# Patient Record
Sex: Female | Born: 1965 | Race: Black or African American | Hispanic: No | Marital: Married | Smoking: Former smoker
Health system: Southern US, Community
[De-identification: ages and names within clinical notes are randomized; demographics above are authoritative.]

## PROBLEM LIST (undated history)

## (undated) DIAGNOSIS — N200 Calculus of kidney: Secondary | ICD-10-CM

## (undated) DIAGNOSIS — I10 Essential (primary) hypertension: Secondary | ICD-10-CM

## (undated) DIAGNOSIS — G510 Bell's palsy: Secondary | ICD-10-CM

---

## 1998-03-09 ENCOUNTER — Emergency Department (HOSPITAL_COMMUNITY): Admission: EM | Admit: 1998-03-09 | Discharge: 1998-03-09 | Payer: Self-pay | Admitting: Emergency Medicine

## 2000-01-01 ENCOUNTER — Ambulatory Visit (HOSPITAL_COMMUNITY): Admission: RE | Admit: 2000-01-01 | Discharge: 2000-01-01 | Payer: Self-pay | Admitting: *Deleted

## 2001-01-08 ENCOUNTER — Other Ambulatory Visit: Admission: RE | Admit: 2001-01-08 | Discharge: 2001-01-08 | Payer: Self-pay | Admitting: Family Medicine

## 2001-12-24 ENCOUNTER — Emergency Department (HOSPITAL_COMMUNITY): Admission: EM | Admit: 2001-12-24 | Discharge: 2001-12-24 | Payer: Self-pay | Admitting: Emergency Medicine

## 2003-06-08 ENCOUNTER — Ambulatory Visit (HOSPITAL_COMMUNITY): Admission: RE | Admit: 2003-06-08 | Discharge: 2003-06-08 | Payer: Self-pay | Admitting: Internal Medicine

## 2003-06-08 ENCOUNTER — Encounter: Payer: Self-pay | Admitting: Internal Medicine

## 2005-07-30 ENCOUNTER — Emergency Department (HOSPITAL_COMMUNITY): Admission: EM | Admit: 2005-07-30 | Discharge: 2005-07-31 | Payer: Self-pay | Admitting: *Deleted

## 2009-01-30 ENCOUNTER — Emergency Department (HOSPITAL_COMMUNITY): Admission: EM | Admit: 2009-01-30 | Discharge: 2009-01-30 | Payer: Self-pay | Admitting: Emergency Medicine

## 2009-04-03 ENCOUNTER — Other Ambulatory Visit: Admission: RE | Admit: 2009-04-03 | Discharge: 2009-04-03 | Payer: Self-pay | Admitting: Family Medicine

## 2010-07-18 IMAGING — CT CT HEAD W/O CM
1 series · 16 of 30 positions shown, 20 images · non-contrast
Comparison: None

CLINICAL DATA: Headache.  Left facial numbness and drooping.

CT HEAD WITHOUT CONTRAST
TECHNIQUE: Contiguous axial images were obtained from the base of
the skull through the vertex without contrast.

[Series 2: head_seq 4.5 h37s st · axial · 0.43mm/px · z∈[-102,+24]mm · 16 of 32 slices shown, 20 images]
[im 2/32  brain]
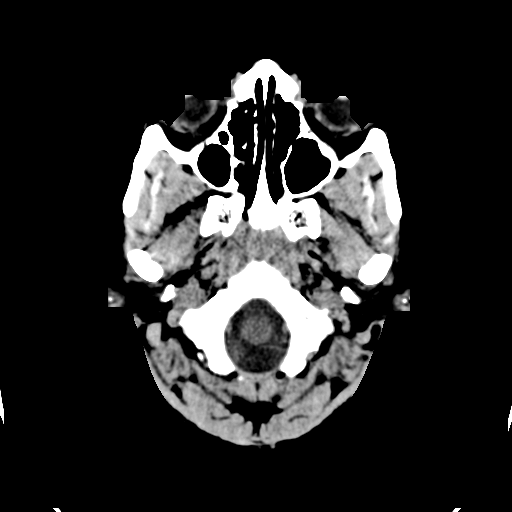
[im 2/32  bone]
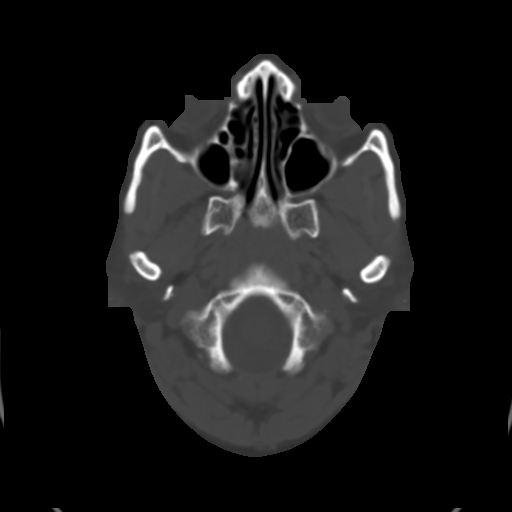
[im 4/32  brain]
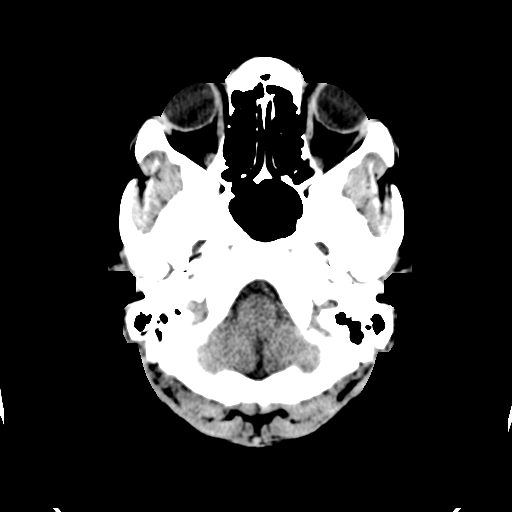
[im 6/32  brain]
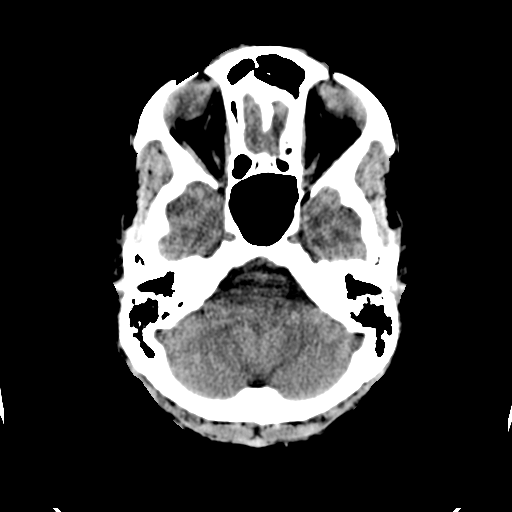
[im 8/32  brain]
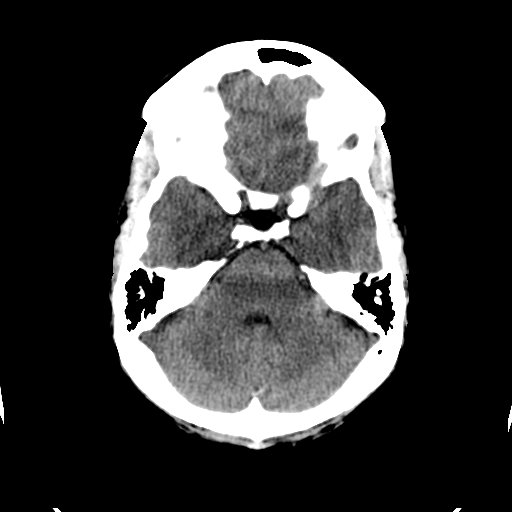
[im 9/32  brain]
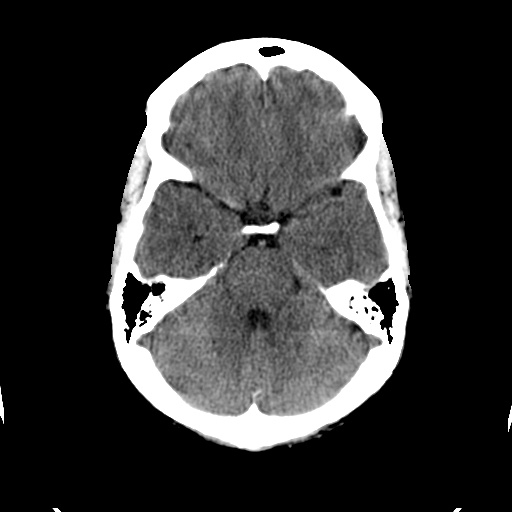
[im 9/32  bone]
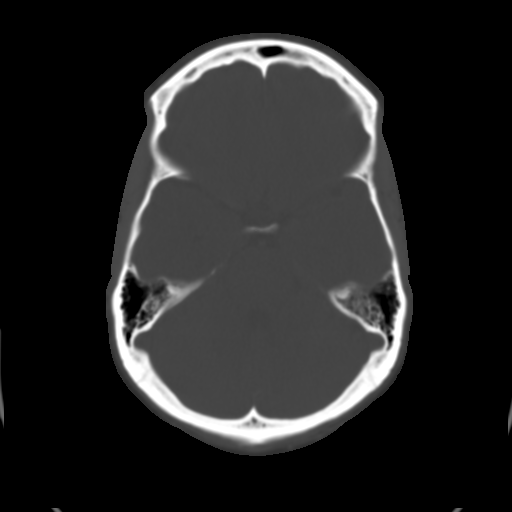
[im 11/32  brain]
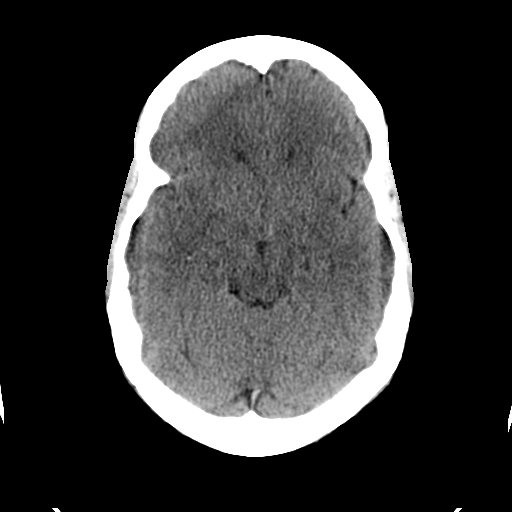
[im 13/32  brain]
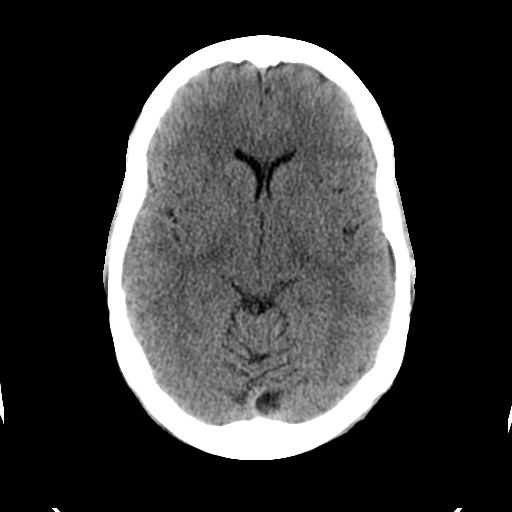
[im 15/32  brain]
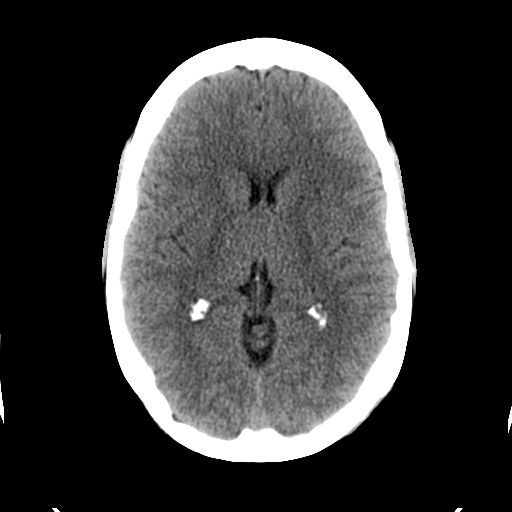
[im 17/32  brain]
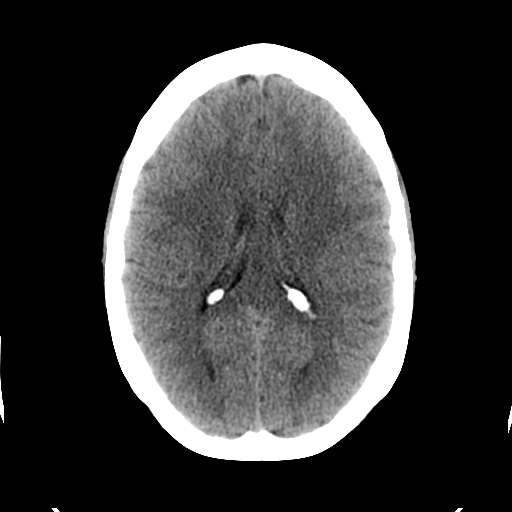
[im 17/32  bone]
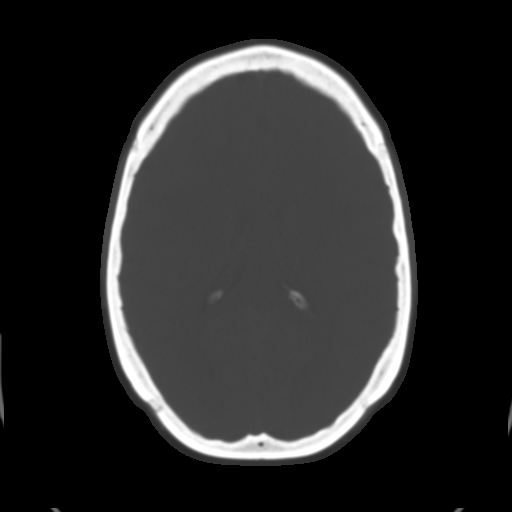
[im 19/32  brain]
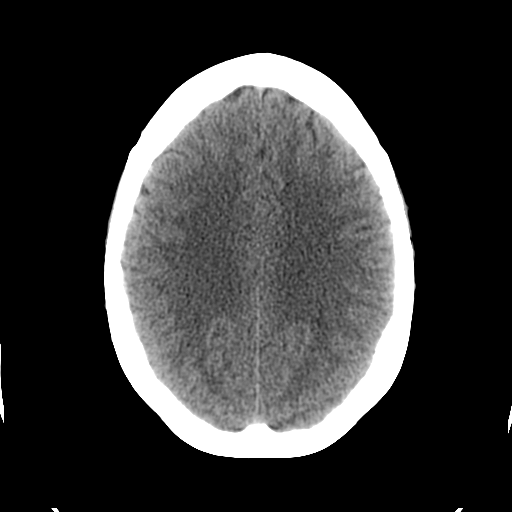
[im 21/32  brain]
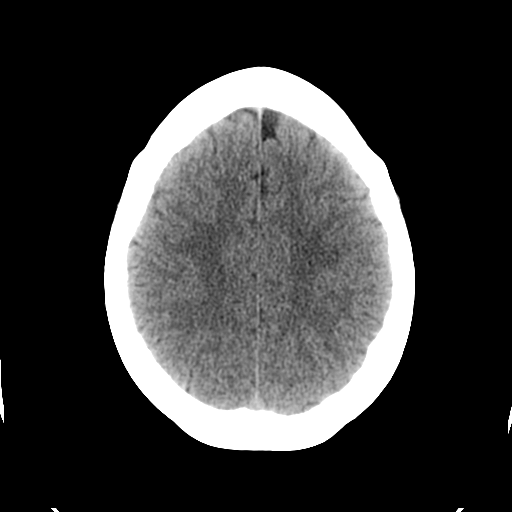
[im 23/32  brain]
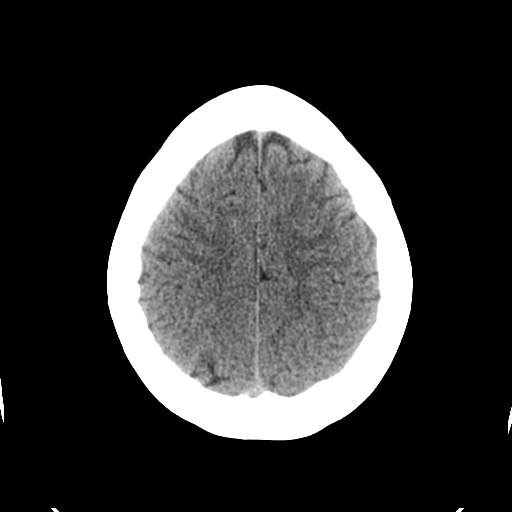
[im 24/32  brain]
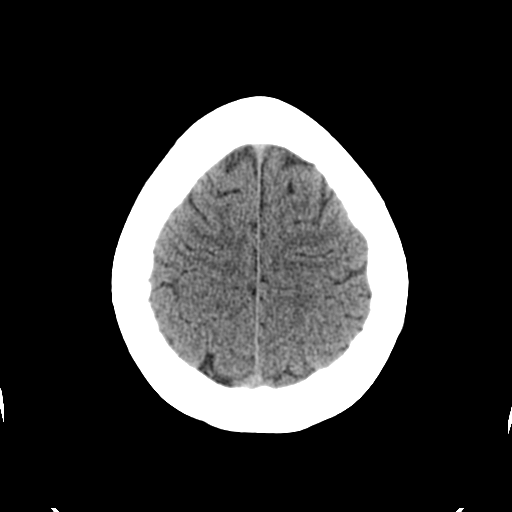
[im 24/32  bone]
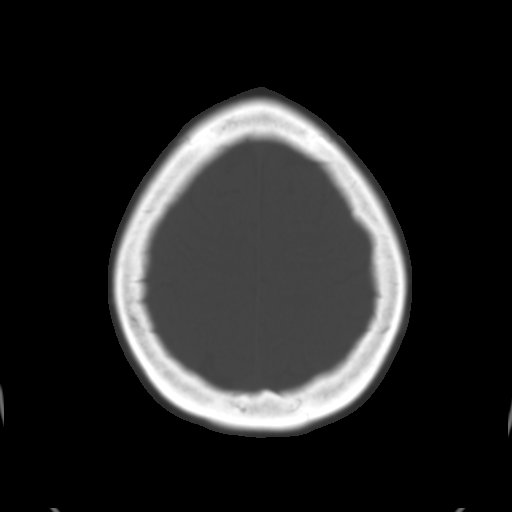
[im 26/32  brain]
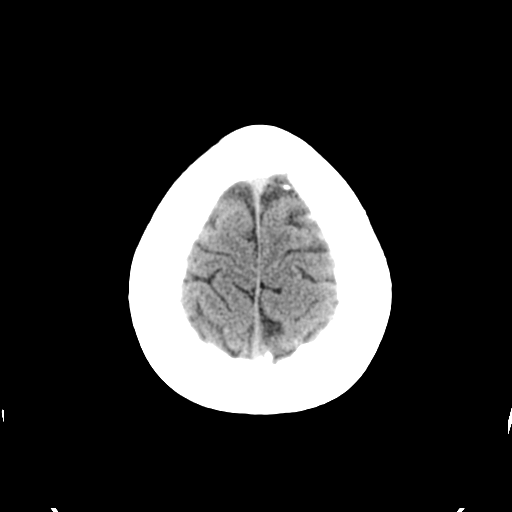
[im 28/32  brain]
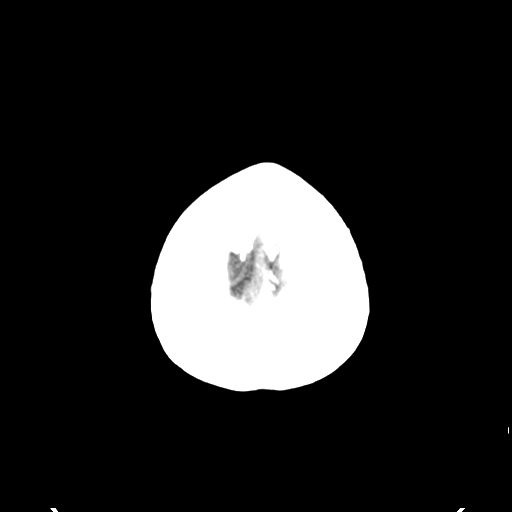
[im 30/32  brain]
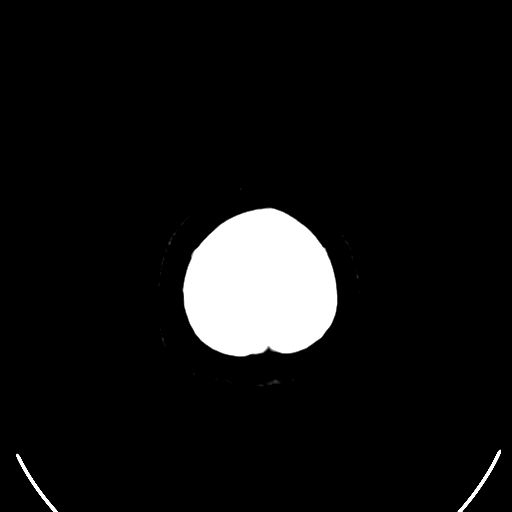

[16 of 30 positions shown; findings below may reference images not displayed]

FINDINGS: The brain stem, cerebellum, cerebral peduncles, thalami,
basal ganglia, basilar cisterns, and ventricular system appear
unremarkable.

No intracranial hemorrhage, mass lesion, or acute infarction is
identified.
IMPRESSION: 1.  No acute intracranial findings are identified. Please note that
acute CVA can be occult on CT scan.

## 2015-06-06 ENCOUNTER — Emergency Department (HOSPITAL_COMMUNITY)
Admission: EM | Admit: 2015-06-06 | Discharge: 2015-06-06 | Disposition: A | Discharge status: Home / Self Care | Payer: Self-pay | Attending: Emergency Medicine | Admitting: Emergency Medicine

## 2015-06-06 ENCOUNTER — Encounter (HOSPITAL_COMMUNITY): Payer: Self-pay | Admitting: Emergency Medicine

## 2015-06-06 DIAGNOSIS — Z87442 Personal history of urinary calculi: Secondary | ICD-10-CM | POA: Insufficient documentation

## 2015-06-06 DIAGNOSIS — Z720 Tobacco use: Secondary | ICD-10-CM | POA: Insufficient documentation

## 2015-06-06 DIAGNOSIS — R109 Unspecified abdominal pain: Secondary | ICD-10-CM | POA: Insufficient documentation

## 2015-06-06 HISTORY — DX: Calculus of kidney: N20.0

## 2015-06-06 LAB — CBC WITH DIFFERENTIAL/PLATELET
Basophils Absolute: 0 10*3/uL (ref 0.0–0.1)
Basophils Relative: 0 % (ref 0–1)
Eosinophils Absolute: 0.1 10*3/uL (ref 0.0–0.7)
Eosinophils Relative: 1 % (ref 0–5)
HCT: 36.7 % (ref 36.0–46.0)
Hemoglobin: 12.7 g/dL (ref 12.0–15.0)
Lymphocytes Relative: 50 % — ABNORMAL HIGH (ref 12–46)
Lymphs Abs: 2.7 10*3/uL (ref 0.7–4.0)
MCH: 30.7 pg (ref 26.0–34.0)
MCHC: 34.6 g/dL (ref 30.0–36.0)
MCV: 88.6 fL (ref 78.0–100.0)
Monocytes Absolute: 0.3 10*3/uL (ref 0.1–1.0)
Monocytes Relative: 5 % (ref 3–12)
Neutro Abs: 2.5 10*3/uL (ref 1.7–7.7)
Neutrophils Relative %: 44 % (ref 43–77)
Platelets: 252 10*3/uL (ref 150–400)
RBC: 4.14 MIL/uL (ref 3.87–5.11)
RDW: 12.8 % (ref 11.5–15.5)
WBC: 5.6 10*3/uL (ref 4.0–10.5)

## 2015-06-06 LAB — URINE MICROSCOPIC-ADD ON

## 2015-06-06 LAB — COMPREHENSIVE METABOLIC PANEL
ALBUMIN: 4 g/dL (ref 3.5–5.0)
ALK PHOS: 97 U/L (ref 38–126)
ALT: 14 U/L (ref 14–54)
ANION GAP: 5 (ref 5–15)
AST: 23 U/L (ref 15–41)
BUN: 13 mg/dL (ref 6–20)
CO2: 26 mmol/L (ref 22–32)
CREATININE: 0.74 mg/dL (ref 0.44–1.00)
Calcium: 8.8 mg/dL — ABNORMAL LOW (ref 8.9–10.3)
Chloride: 105 mmol/L (ref 101–111)
GFR calc non Af Amer: >60 mL/min (ref >60)
GLUCOSE: 107 mg/dL — AB (ref 65–99)
POTASSIUM: 3.3 mmol/L — AB (ref 3.5–5.1)
Sodium: 136 mmol/L (ref 135–145)
Total Bilirubin: 0.4 mg/dL (ref 0.3–1.2)
Total Protein: 7.9 g/dL (ref 6.5–8.1)

## 2015-06-06 LAB — URINALYSIS, ROUTINE W REFLEX MICROSCOPIC
BILIRUBIN URINE: NEGATIVE
GLUCOSE, UA: NEGATIVE mg/dL
KETONES UR: NEGATIVE mg/dL
Nitrite: NEGATIVE
Protein, ur: NEGATIVE mg/dL
Specific Gravity, Urine: 1.023 (ref 1.005–1.030)
Urobilinogen, UA: 0.2 mg/dL (ref 0.0–1.0)
pH: 6.5 (ref 5.0–8.0)

## 2015-06-06 MED ORDER — IBUPROFEN 800 MG PO TABS
800.0000 mg | ORAL_TABLET | Freq: Once | ORAL | Status: AC
  Administered 2015-06-06: 800 mg via ORAL
  Filled 2015-06-06: qty 1

## 2015-06-06 MED ORDER — IBUPROFEN 600 MG PO TABS: 600.0000 mg | ORAL_TABLET | Freq: Three times a day (TID) | ORAL | Status: DC | PRN

## 2015-06-06 NOTE — ED Notes (Signed)
Pt reports RLQ pain for past week that has gradually gotten worse. Hx of kidney stones, pt reports it feels like same. Also reports urinary frequency and discharge (unsure if vaginal). No N/v/d.

## 2015-06-06 NOTE — ED Provider Notes (Signed)
CSN: [id number]     Arrival date & time 06/06/15  0724 History   First MD Initiated Contact with Patient 06/06/15 0730     Chief Complaint  Patient presents with  . Abdominal Pain     HPI Patient presents to the emergency department complaining of right lower quadrant pain over the past week has gradually worsened.  She states it hurts when she walks around and hurts when she sits up.  She denies abdominal tenderness.  No fevers or chills.  Denies nausea vomiting diarrhea.  No melena or hematochezia.  No urinary symptoms.  No back pain or flank pain.   Past Medical History  Diagnosis Date  . Kidney stone    History reviewed. No pertinent past surgical history. History reviewed. No pertinent family history. History  Substance Use Topics  . Smoking status: Current Some Day Smoker  . Smokeless tobacco: Not on file  . Alcohol Use: No   OB History    No data available     Review of Systems  All other systems reviewed and are negative.     Allergies  Review of patient's allergies indicates no known allergies.  Home Medications   Prior to Admission medications   Medication Sig Start Date End Date Taking? Authorizing Provider  ibuprofen (ADVIL,MOTRIN) 600 MG tablet Take 1 tablet (600 mg total) by mouth every 8 (eight) hours as needed. 06/06/15   Azalia Bilis, MD   BP 156/93 mmHg  Pulse 55  Temp(Src) 97.9 F (36.6 C) (Oral)  Resp 16  SpO2 100% Physical Exam  Constitutional: She is oriented to person, place, and time. She appears well-developed and well-nourished. No distress.  HENT:  Head: Normocephalic and atraumatic.  Eyes: EOM are normal.  Neck: Normal range of motion.  Cardiovascular: Normal rate, regular rhythm and normal heart sounds.   Pulmonary/Chest: Effort normal and breath sounds normal.  Abdominal: Soft. She exhibits no distension. There is no tenderness.  Musculoskeletal: Normal range of motion.  Neurological: She is alert and oriented to person, place,  and time.  Skin: Skin is warm and dry.  Psychiatric: She has a normal mood and affect. Judgment normal.  Nursing note and vitals reviewed.   ED Course  Procedures (including critical care time) Labs Review Labs Reviewed  CBC WITH DIFFERENTIAL/PLATELET - Abnormal; Notable for the following:    Lymphocytes Relative 50 (*)    All other components within normal limits  COMPREHENSIVE METABOLIC PANEL - Abnormal; Notable for the following:    Potassium 3.3 (*)    Glucose, Bld 107 (*)    Calcium 8.8 (*)    All other components within normal limits  URINALYSIS, ROUTINE W REFLEX MICROSCOPIC (NOT AT [facility]) - Abnormal; Notable for the following:    APPearance CLOUDY (*)    Hgb urine dipstick SMALL (*)    Leukocytes, UA TRACE (*)    All other components within normal limits  URINE MICROSCOPIC-ADD ON    Imaging Review No results found.   EKG Interpretation None      MDM   Final diagnoses:  Abdominal pain, unspecified abdominal location    Repeat abdominal exam is without tenderness.  No fever.  What blood cell count is normal.  No other associated symptoms.  Urine shows no signs of infection.  Patient feels much better after single dose of ibuprofen.  Discharge home in good condition.  Ambulatory.  Doubt appendicitis.  Doubt ovarian pathology.    Azalia Bilis, MD 06/06/15 1057

## 2015-06-06 NOTE — Discharge Instructions (Signed)
Abdominal Pain, Women °Abdominal (stomach, pelvic, or belly) pain can be caused by many things. It is important to tell your doctor: °· The location of the pain. °· Does it come and go or is it present all the time? °· Are there things that start the pain (eating certain foods, exercise)? °· Are there other symptoms associated with the pain (fever, nausea, vomiting, diarrhea)? °All of this is helpful to know when trying to find the cause of the pain. °CAUSES  °· Stomach: virus or bacteria infection, or ulcer. °· Intestine: appendicitis (inflamed appendix), regional ileitis (Crohn's disease), ulcerative colitis (inflamed colon), irritable bowel syndrome, diverticulitis (inflamed diverticulum of the colon), or cancer of the stomach or intestine. °· Gallbladder disease or stones in the gallbladder. °· Kidney disease, kidney stones, or infection. °· Pancreas infection or cancer. °· Fibromyalgia (pain disorder). °· Diseases of the female organs: °¨ Uterus: fibroid (non-cancerous) tumors or infection. °¨ Fallopian tubes: infection or tubal pregnancy. °¨ Ovary: cysts or tumors. °¨ Pelvic adhesions (scar tissue). °¨ Endometriosis (uterus lining tissue growing in the pelvis and on the pelvic organs). °¨ Pelvic congestion syndrome (female organs filling up with blood just before the menstrual period). °¨ Pain with the menstrual period. °¨ Pain with ovulation (producing an egg). °¨ Pain with an IUD (intrauterine device, birth control) in the uterus. °¨ Cancer of the female organs. °· Functional pain (pain not caused by a disease, may improve without treatment). °· Psychological pain. °· Depression. °DIAGNOSIS  °Your doctor will decide the seriousness of your pain by doing an examination. °· Blood tests. °· X-rays. °· Ultrasound. °· CT scan (computed tomography, special type of X-ray). °· MRI (magnetic resonance imaging). °· Cultures, for infection. °· Barium enema (dye inserted in the large intestine, to better view it with  X-rays). °· Colonoscopy (looking in intestine with a lighted tube). °· Laparoscopy (minor surgery, looking in abdomen with a lighted tube). °· Major abdominal exploratory surgery (looking in abdomen with a large incision). °TREATMENT  °The treatment will depend on the cause of the pain.  °· Many cases can be observed and treated at home. °· Over-the-counter medicines recommended by your caregiver. °· Prescription medicine. °· Antibiotics, for infection. °· Birth control pills, for painful periods or for ovulation pain. °· Hormone treatment, for endometriosis. °· Nerve blocking injections. °· Physical therapy. °· Antidepressants. °· Counseling with a psychologist or psychiatrist. °· Minor or major surgery. °HOME CARE INSTRUCTIONS  °· Do not take laxatives, unless directed by your caregiver. °· Take over-the-counter pain medicine only if ordered by your caregiver. Do not take aspirin because it can cause an upset stomach or bleeding. °· Try a clear liquid diet (broth or water) as ordered by your caregiver. Slowly move to a bland diet, as tolerated, if the pain is related to the stomach or intestine. °· Have a thermometer and take your temperature several times a day, and record it. °· Bed rest and sleep, if it helps the pain. °· Avoid sexual intercourse, if it causes pain. °· Avoid stressful situations. °· Keep your follow-up appointments and tests, as your caregiver orders. °· If the pain does not go away with medicine or surgery, you may try: °¨ Acupuncture. °¨ Relaxation exercises (yoga, meditation). °¨ Group therapy. °¨ Counseling. °SEEK MEDICAL CARE IF:  °· You notice certain foods cause stomach pain. °· Your home care treatment is not helping your pain. °· You need stronger pain medicine. °· You want your IUD removed. °· You feel faint or   lightheaded. °· You develop nausea and vomiting. °· You develop a rash. °· You are having side effects or an allergy to your medicine. °SEEK IMMEDIATE MEDICAL CARE IF:  °· Your  pain does not go away or gets worse. °· You have a fever. °· Your pain is felt only in portions of the abdomen. The right side could possibly be appendicitis. The left lower portion of the abdomen could be colitis or diverticulitis. °· You are passing blood in your stools (bright red or black tarry stools, with or without vomiting). °· You have blood in your urine. °· You develop chills, with or without a fever. °· You pass out. °MAKE SURE YOU:  °· Understand these instructions. °· Will watch your condition. °· Will get help right away if you are not doing well or get worse. °Document Released: 08/25/2007 Document Revised: 03/14/2014 Document Reviewed: 09/14/2009 °[employer]® Patient Information ©2015 [employer]. This information is not intended to replace advice given to you by your health care provider. Make sure you discuss any questions you have with your health care provider. ° °

## 2015-06-06 NOTE — ED Notes (Signed)
MD at bedside. 

## 2016-12-29 ENCOUNTER — Encounter (HOSPITAL_COMMUNITY): Payer: Self-pay | Admitting: Nurse Practitioner

## 2016-12-29 ENCOUNTER — Emergency Department (HOSPITAL_COMMUNITY)
Admission: EM | Admit: 2016-12-29 | Discharge: 2016-12-29 | Disposition: A | Discharge status: Home / Self Care | Payer: Self-pay | Attending: Emergency Medicine | Admitting: Emergency Medicine

## 2016-12-29 DIAGNOSIS — Z87891 Personal history of nicotine dependence: Secondary | ICD-10-CM | POA: Insufficient documentation

## 2016-12-29 DIAGNOSIS — Z79899 Other long term (current) drug therapy: Secondary | ICD-10-CM | POA: Insufficient documentation

## 2016-12-29 DIAGNOSIS — R519 Headache, unspecified: Secondary | ICD-10-CM

## 2016-12-29 DIAGNOSIS — R51 Headache: Secondary | ICD-10-CM | POA: Insufficient documentation

## 2016-12-29 HISTORY — DX: Bell's palsy: G51.0

## 2016-12-29 MED ORDER — OXYCODONE HCL 5 MG PO TABS
5.0000 mg | ORAL_TABLET | Freq: Once | ORAL | Status: AC
  Administered 2016-12-29: 5 mg via ORAL
  Filled 2016-12-29: qty 1

## 2016-12-29 MED ORDER — MELOXICAM 15 MG PO TABS: 15.0000 mg | ORAL_TABLET | Freq: Every day | ORAL | 0 refills | Status: DC

## 2016-12-29 MED ORDER — DIPHENHYDRAMINE HCL 50 MG/ML IJ SOLN
25.0000 mg | Freq: Once | INTRAMUSCULAR | Status: AC
  Administered 2016-12-29: 25 mg via INTRAVENOUS
  Filled 2016-12-29: qty 1

## 2016-12-29 MED ORDER — SODIUM CHLORIDE 0.9 % IV BOLUS (SEPSIS)
1000.0000 mL | Freq: Once | INTRAVENOUS | Status: AC
  Administered 2016-12-29: 1000 mL via INTRAVENOUS

## 2016-12-29 MED ORDER — KETOROLAC TROMETHAMINE 30 MG/ML IJ SOLN
30.0000 mg | Freq: Once | INTRAMUSCULAR | Status: AC
  Administered 2016-12-29: 30 mg via INTRAVENOUS
  Filled 2016-12-29: qty 1

## 2016-12-29 MED ORDER — PROCHLORPERAZINE EDISYLATE 5 MG/ML IJ SOLN
10.0000 mg | Freq: Once | INTRAMUSCULAR | Status: AC
  Administered 2016-12-29: 10 mg via INTRAVENOUS
  Filled 2016-12-29: qty 2

## 2016-12-29 MED ORDER — MORPHINE SULFATE (PF) 4 MG/ML IV SOLN
4.0000 mg | Freq: Once | INTRAVENOUS | Status: AC
  Administered 2016-12-29: 4 mg via INTRAVENOUS
  Filled 2016-12-29: qty 1

## 2016-12-29 NOTE — Discharge Instructions (Signed)
SEEK IMMEDIATE MEDICAL ATTENTION IF: New numbness, tingling, weakness, or problem with the use of your arms or legs.  Severe back pain not relieved with medications.  Change in bowel or bladder control.  Increasing pain in any areas of the body (such as chest or abdominal pain).  Shortness of breath, dizziness or fainting.  Nausea (feeling sick to your stomach), vomiting, fever, or sweats.  

## 2016-12-29 NOTE — ED Provider Notes (Signed)
[facility]-EMERGENCY DEPT Provider Note   CSN: [id number] Arrival date & time: 12/29/16  0533     History   Chief Complaint Chief Complaint  Patient presents with  . Migraine    HPI  Tara Barry is a 51 y.o. female who complains of migraine headache for 5 day(s). She has a well established history of recurrent migraines. Description of pain: throbbing pain. Associated symptoms: dehydration, light sensitivity and nausea. Patient has already taken motrin for this headache without relief. There are no associated abnormal neurological symptoms such as TIA's, loss of balance, loss of vision or speech, numbness or weakness on review. Past neurological history: negative for stroke, MS, epilepsy, or brain tumor.    No current facility-administered medications for this encounter.    Current Outpatient Prescriptions  Medication Sig Dispense Refill  . DM-Doxylamine-Acetaminophen (NYQUIL COLD & FLU PO) Take 30 mLs by mouth every 4 (four) hours as needed (headache).    . Pseudoephedrine-Ibuprofen (ADVIL COLD/SINUS PO) Take 1 tablet by mouth every 4 (four) hours as needed (cold symptoms).    Marland Kitchen ibuprofen (ADVIL,MOTRIN) 600 MG tablet Take 1 tablet (600 mg total) by mouth every 8 (eight) hours as needed. (Patient not taking: Reported on 12/29/2016) 15 tablet 0       HPI  Past Medical History:  Diagnosis Date  . Bell's palsy   . Kidney stone     There are no active problems to display for this patient.   History reviewed. No pertinent surgical history.  OB History    Gravida Para Term Preterm AB Living   3 3           SAB TAB Ectopic Multiple Live Births                   Home Medications    Prior to Admission medications   Medication Sig Start Date End Date Taking? Authorizing Provider  DM-Doxylamine-Acetaminophen (NYQUIL COLD & FLU PO) Take 30 mLs by mouth every 4 (four) hours as needed (headache).   Yes Historical Provider, MD  Pseudoephedrine-Ibuprofen (ADVIL COLD/SINUS PO)  Take 1 tablet by mouth every 4 (four) hours as needed (cold symptoms).   Yes Historical Provider, MD  ibuprofen (ADVIL,MOTRIN) 600 MG tablet Take 1 tablet (600 mg total) by mouth every 8 (eight) hours as needed. Patient not taking: Reported on 12/29/2016 06/06/15   Azalia Bilis, MD    Family History History reviewed. No pertinent family history.  Social History Social History  Substance Use Topics  . Smoking status: Former Smoker    Types: Cigarettes    Quit date: 12/29/2014  . Smokeless tobacco: Never Used  . Alcohol use Yes     Comment: drinks socially, maybe 1-2 drinks a month     Allergies   Tylenol [acetaminophen]   Review of Systems Review of Systems Ten systems reviewed and are negative for acute change, except as noted in the HPI.    Physical Exam Updated Vital Signs BP 173/84 (BP Location: Right Arm)   Pulse (!) 58   Temp 98.4 F (36.9 C) (Oral)   Resp 18   Ht 5\' 5"  (1.651 m)   Wt 63.5 kg   SpO2 100%   BMI 23.30 kg/m   Physical Exam  Constitutional: She is oriented to person, place, and time. She appears well-developed and well-nourished. No distress.  HENT:  Head: Normocephalic and atraumatic.  Mouth/Throat: Oropharynx is clear and moist.  Eyes: Conjunctivae and EOM are normal. Pupils are equal, round,  and reactive to light. No scleral icterus.  No horizontal, vertical or rotational nystagmus  Neck: Normal range of motion. Neck supple.  Full active and passive ROM without pain No midline or paraspinal tenderness No nuchal rigidity or meningeal signs  Cardiovascular: Normal rate, regular rhythm and intact distal pulses.   Pulmonary/Chest: Effort normal and breath sounds normal. No respiratory distress. She has no wheezes. She has no rales.  Abdominal: Soft. Bowel sounds are normal. There is no tenderness. There is no rebound and no guarding.  Musculoskeletal: Normal range of motion.  Lymphadenopathy:    She has no cervical adenopathy.  Neurological:  She is alert and oriented to person, place, and time. No cranial nerve deficit. She exhibits normal muscle tone. Coordination normal.  Mental Status:  Alert, oriented, thought content appropriate. Speech fluent without evidence of aphasia. Able to follow 2 step commands without difficulty.  Cranial Nerves:  II:  Peripheral visual fields grossly normal, pupils equal, round, reactive to light III,IV, VI: ptosis not present, extra-ocular motions intact bilaterally  V,VII: smile symmetric, facial light touch sensation equal VIII: hearing grossly normal bilaterally  IX,X: midline uvula rise  XI: bilateral shoulder shrug equal and strong XII: midline tongue extension  Motor:  5/5 in upper and lower extremities bilaterally including strong and equal grip strength and dorsiflexion/plantar flexion Sensory: Pinprick and light touch normal in all extremities.  Cerebellar: normal finger-to-nose with bilateral upper extremities Gait: normal gait and balance CV: distal pulses palpable throughout   Skin: Skin is warm and dry. No rash noted. She is not diaphoretic.  Psychiatric: She has a normal mood and affect. Her behavior is normal. Judgment and thought content normal.  Nursing note and vitals reviewed.    ED Treatments / Results  Labs (all labs ordered are listed, but only abnormal results are displayed) Labs Reviewed  BASIC METABOLIC PANEL  CBC    EKG  EKG Interpretation None       Radiology No results found.  Procedures Procedures (including critical care time)  Medications Ordered in ED Medications  sodium chloride 0.9 % bolus 1,000 mL (not administered)  ketorolac (TORADOL) 30 MG/ML injection 30 mg (not administered)  prochlorperazine (COMPAZINE) injection 10 mg (not administered)  diphenhydrAMINE (BENADRYL) injection 25 mg (not administered)     Initial Impression / Assessment and Plan / ED Course  I have reviewed the triage vital signs and the nursing  notes.  Pertinent labs & imaging results that were available during my care of the patient were reviewed by me and considered in my medical decision making (see chart for details).     Pt HA treated and improved while in ED.  [facility]ation [facility] not [facility], [facility], Meningitis, or temporal arteritis. Pt is afebrile with no focal neuro deficits, nuchal rigidity, or change in vision. Pt is to follow up with PCP to discuss prophylactic medication. Pt verbalizes understanding and is agreeable with plan to dc.    Final Clinical Impressions(s) / ED Diagnoses   Final diagnoses:  Bad headache    New Prescriptions New Prescriptions   No medications on file     Arthor Captainbigail Gad Aymond, PA-C 12/29/16 [phone]    Melene Planan Floyd, DO 12/29/16 [phone]

## 2016-12-29 NOTE — ED Triage Notes (Signed)
Patient states headache just came on all of a sudden Friday. Did have one episode of n/v due to headache. States lights make the headache worst. She was taking advil was helping some but the headache would just come back. Denies any recent fevers, trauma, cold, congestion. States she couldn't bare the headache anymore and started having chills so this is what brought her in this morning. States she hasn't eaten in 5 days because she had been nauseated.

## 2023-02-18 ENCOUNTER — Other Ambulatory Visit: Payer: Self-pay | Admitting: Internal Medicine

## 2023-02-18 DIAGNOSIS — Z1231 Encounter for screening mammogram for malignant neoplasm of breast: Secondary | ICD-10-CM

## 2023-05-15 DIAGNOSIS — G8929 Other chronic pain: Secondary | ICD-10-CM | POA: Diagnosis not present

## 2023-05-15 DIAGNOSIS — Z823 Family history of stroke: Secondary | ICD-10-CM | POA: Diagnosis not present

## 2023-05-15 DIAGNOSIS — Z720 Tobacco use: Secondary | ICD-10-CM | POA: Diagnosis not present

## 2023-05-15 DIAGNOSIS — I10 Essential (primary) hypertension: Secondary | ICD-10-CM | POA: Diagnosis not present

## 2023-05-15 DIAGNOSIS — Z791 Long term (current) use of non-steroidal anti-inflammatories (NSAID): Secondary | ICD-10-CM | POA: Diagnosis not present

## 2024-07-22 ENCOUNTER — Other Ambulatory Visit: Payer: Self-pay | Admitting: Physician Assistant

## 2024-07-22 DIAGNOSIS — Z1231 Encounter for screening mammogram for malignant neoplasm of breast: Secondary | ICD-10-CM

## 2024-09-01 ENCOUNTER — Ambulatory Visit
Admission: RE | Admit: 2024-09-01 | Discharge: 2024-09-01 | Disposition: A | Discharge status: Home / Self Care | Payer: Self-pay | Source: Ambulatory Visit | Attending: Physician Assistant | Admitting: Physician Assistant

## 2024-09-01 DIAGNOSIS — Z1231 Encounter for screening mammogram for malignant neoplasm of breast: Secondary | ICD-10-CM

## 2024-09-06 ENCOUNTER — Other Ambulatory Visit: Payer: Self-pay | Admitting: Physician Assistant

## 2024-09-06 DIAGNOSIS — R928 Other abnormal and inconclusive findings on diagnostic imaging of breast: Secondary | ICD-10-CM

## 2024-10-01 ENCOUNTER — Encounter

## 2024-10-01 ENCOUNTER — Other Ambulatory Visit

## 2024-10-03 ENCOUNTER — Ambulatory Visit (HOSPITAL_COMMUNITY): Admission: EM | Admit: 2024-10-03 | Discharge: 2024-10-03 | Disposition: A | Discharge status: Home / Self Care

## 2024-10-03 DIAGNOSIS — R319 Hematuria, unspecified: Secondary | ICD-10-CM

## 2024-10-03 NOTE — ED Notes (Signed)
Call patient from the lobby- no answer.

## 2024-10-03 NOTE — ED Notes (Signed)
 First attempt to call patient in lobby. No response

## 2024-10-14 ENCOUNTER — Inpatient Hospital Stay
Admission: RE | Admit: 2024-10-14 | Discharge: 2024-10-14 | Attending: Physician Assistant | Admitting: Physician Assistant

## 2024-10-14 ENCOUNTER — Encounter: Payer: Self-pay | Admitting: Physician Assistant

## 2024-10-14 ENCOUNTER — Other Ambulatory Visit: Payer: Self-pay | Admitting: Physician Assistant

## 2024-10-14 DIAGNOSIS — R928 Other abnormal and inconclusive findings on diagnostic imaging of breast: Secondary | ICD-10-CM

## 2024-10-14 DIAGNOSIS — N63 Unspecified lump in unspecified breast: Secondary | ICD-10-CM

## 2024-10-19 ENCOUNTER — Other Ambulatory Visit

## 2024-10-21 ENCOUNTER — Other Ambulatory Visit: Payer: Self-pay | Admitting: Physician Assistant

## 2024-10-21 ENCOUNTER — Encounter: Payer: Self-pay | Admitting: Physician Assistant

## 2024-10-21 ENCOUNTER — Inpatient Hospital Stay
Admission: RE | Admit: 2024-10-21 | Discharge: 2024-10-21 | Attending: Physician Assistant | Admitting: Physician Assistant

## 2024-10-21 ENCOUNTER — Ambulatory Visit
Admission: RE | Admit: 2024-10-21 | Discharge: 2024-10-21 | Disposition: A | Discharge status: Home / Self Care | Source: Ambulatory Visit | Attending: Physician Assistant | Admitting: Physician Assistant

## 2024-10-21 DIAGNOSIS — R928 Other abnormal and inconclusive findings on diagnostic imaging of breast: Secondary | ICD-10-CM

## 2024-10-21 DIAGNOSIS — N63 Unspecified lump in unspecified breast: Secondary | ICD-10-CM

## 2024-10-21 HISTORY — PX: BREAST BIOPSY: SHX20

## 2024-10-22 LAB — SURGICAL PATHOLOGY

## 2024-11-09 ENCOUNTER — Other Ambulatory Visit: Payer: Self-pay

## 2024-11-09 DIAGNOSIS — I739 Peripheral vascular disease, unspecified: Secondary | ICD-10-CM

## 2024-11-25 ENCOUNTER — Emergency Department (HOSPITAL_COMMUNITY)

## 2024-11-25 ENCOUNTER — Encounter (HOSPITAL_COMMUNITY): Payer: Self-pay

## 2024-11-25 ENCOUNTER — Observation Stay (HOSPITAL_COMMUNITY)
Admission: EM | Admit: 2024-11-25 | Discharge: 2024-11-28 | Disposition: A | Discharge status: Home / Self Care | Source: Ambulatory Visit | Attending: Emergency Medicine | Admitting: Emergency Medicine

## 2024-11-25 DIAGNOSIS — E1100 Type 2 diabetes mellitus with hyperosmolarity without nonketotic hyperglycemic-hyperosmolar coma (NKHHC): Secondary | ICD-10-CM | POA: Diagnosis not present

## 2024-11-25 DIAGNOSIS — F17210 Nicotine dependence, cigarettes, uncomplicated: Secondary | ICD-10-CM | POA: Diagnosis not present

## 2024-11-25 DIAGNOSIS — E871 Hypo-osmolality and hyponatremia: Secondary | ICD-10-CM | POA: Insufficient documentation

## 2024-11-25 DIAGNOSIS — E119 Type 2 diabetes mellitus without complications: Secondary | ICD-10-CM

## 2024-11-25 DIAGNOSIS — R944 Abnormal results of kidney function studies: Secondary | ICD-10-CM | POA: Diagnosis not present

## 2024-11-25 DIAGNOSIS — F12920 Cannabis use, unspecified with intoxication, uncomplicated: Secondary | ICD-10-CM | POA: Insufficient documentation

## 2024-11-25 DIAGNOSIS — Z79899 Other long term (current) drug therapy: Secondary | ICD-10-CM | POA: Insufficient documentation

## 2024-11-25 DIAGNOSIS — R7989 Other specified abnormal findings of blood chemistry: Secondary | ICD-10-CM | POA: Insufficient documentation

## 2024-11-25 DIAGNOSIS — E876 Hypokalemia: Secondary | ICD-10-CM | POA: Insufficient documentation

## 2024-11-25 DIAGNOSIS — I10 Essential (primary) hypertension: Secondary | ICD-10-CM | POA: Diagnosis present

## 2024-11-25 DIAGNOSIS — R739 Hyperglycemia, unspecified: Principal | ICD-10-CM

## 2024-11-25 HISTORY — DX: Essential (primary) hypertension: I10

## 2024-11-25 LAB — CBC WITH DIFFERENTIAL/PLATELET
Abs Immature Granulocytes: 0 K/uL (ref 0.00–0.07)
Basophils Absolute: 0 K/uL (ref 0.0–0.1)
Basophils Relative: 1 %
Eosinophils Absolute: 0 K/uL (ref 0.0–0.5)
Eosinophils Relative: 1 %
HCT: 38.8 % (ref 36.0–46.0)
Hemoglobin: 13.4 g/dL (ref 12.0–15.0)
Immature Granulocytes: 0 %
Lymphocytes Relative: 45 %
Lymphs Abs: 2.2 K/uL (ref 0.7–4.0)
MCH: 30.4 pg (ref 26.0–34.0)
MCHC: 34.5 g/dL (ref 30.0–36.0)
MCV: 88 fL (ref 80.0–100.0)
Monocytes Absolute: 0.4 K/uL (ref 0.1–1.0)
Monocytes Relative: 7 %
Neutro Abs: 2.2 K/uL (ref 1.7–7.7)
Neutrophils Relative %: 46 %
Platelets: 196 K/uL (ref 150–400)
RBC: 4.41 MIL/uL (ref 3.87–5.11)
RDW: 12.1 % (ref 11.5–15.5)
WBC: 4.8 K/uL (ref 4.0–10.5)
nRBC: 0 % (ref 0.0–0.2)

## 2024-11-25 LAB — BASIC METABOLIC PANEL WITH GFR
Anion gap: 14 (ref 5–15)
Anion gap: 14 (ref 5–15)
BUN: 14 mg/dL (ref 6–20)
BUN: 12 mg/dL (ref 6–20)
CO2: 21 mmol/L — ABNORMAL LOW (ref 22–32)
CO2: 21 mmol/L — ABNORMAL LOW (ref 22–32)
Calcium: 9.6 mg/dL (ref 8.9–10.3)
Calcium: 9.4 mg/dL (ref 8.9–10.3)
Chloride: 85 mmol/L — ABNORMAL LOW (ref 98–111)
Chloride: 92 mmol/L — ABNORMAL LOW (ref 98–111)
Creatinine, Ser: 1.18 mg/dL — ABNORMAL HIGH (ref 0.44–1.00)
Creatinine, Ser: 0.97 mg/dL (ref 0.44–1.00)
GFR, Estimated: 53 mL/min — ABNORMAL LOW
GFR, Estimated: >60 mL/min
Glucose, Bld: 824 mg/dL (ref 70–99)
Glucose, Bld: 536 mg/dL (ref 70–99)
Potassium: 4.2 mmol/L (ref 3.5–5.1)
Potassium: 3.8 mmol/L (ref 3.5–5.1)
Sodium: 120 mmol/L — ABNORMAL LOW (ref 135–145)
Sodium: 126 mmol/L — ABNORMAL LOW (ref 135–145)

## 2024-11-25 LAB — COMPREHENSIVE METABOLIC PANEL WITH GFR
ALT: 53 U/L — ABNORMAL HIGH (ref 0–44)
AST: 36 U/L (ref 15–41)
Albumin: 3.8 g/dL (ref 3.5–5.0)
Alkaline Phosphatase: 257 U/L — ABNORMAL HIGH (ref 38–126)
Anion gap: 13 (ref 5–15)
BUN: 15 mg/dL (ref 6–20)
CO2: 23 mmol/L (ref 22–32)
Calcium: 9.6 mg/dL (ref 8.9–10.3)
Chloride: 85 mmol/L — ABNORMAL LOW (ref 98–111)
Creatinine, Ser: 1.24 mg/dL — ABNORMAL HIGH (ref 0.44–1.00)
GFR, Estimated: 50 mL/min — ABNORMAL LOW
Glucose, Bld: 931 mg/dL (ref 70–99)
Potassium: 4.7 mmol/L (ref 3.5–5.1)
Sodium: 121 mmol/L — ABNORMAL LOW (ref 135–145)
Total Bilirubin: 0.5 mg/dL (ref 0.0–1.2)
Total Protein: 8.2 g/dL — ABNORMAL HIGH (ref 6.5–8.1)

## 2024-11-25 LAB — URINALYSIS, ROUTINE W REFLEX MICROSCOPIC
Bilirubin Urine: NEGATIVE
Glucose, UA: >=500 mg/dL — AB
Hgb urine dipstick: NEGATIVE
Ketones, ur: NEGATIVE mg/dL
Leukocytes,Ua: NEGATIVE
Nitrite: NEGATIVE
Protein, ur: NEGATIVE mg/dL
Specific Gravity, Urine: 1.026 (ref 1.005–1.030)
pH: 6 (ref 5.0–8.0)

## 2024-11-25 LAB — BLOOD GAS, VENOUS
Acid-Base Excess: 6 mmol/L — ABNORMAL HIGH (ref 0.0–2.0)
Bicarbonate: 33 mmol/L — ABNORMAL HIGH (ref 20.0–28.0)
O2 Saturation: 19.3 %
Patient temperature: 37
pCO2, Ven: 57 mmHg (ref 44–60)
pH, Ven: 7.37 (ref 7.25–7.43)
pO2, Ven: <31 mmHg — CL (ref 32–45)

## 2024-11-25 LAB — CBG MONITORING, ED
Glucose-Capillary: >600 mg/dL (ref 70–99)
Glucose-Capillary: >600 mg/dL (ref 70–99)
Glucose-Capillary: 510 mg/dL (ref 70–99)
Glucose-Capillary: 425 mg/dL — ABNORMAL HIGH (ref 70–99)
Glucose-Capillary: 349 mg/dL — ABNORMAL HIGH (ref 70–99)
Glucose-Capillary: 300 mg/dL — ABNORMAL HIGH (ref 70–99)

## 2024-11-25 LAB — TSH: TSH: 1.42 u[IU]/mL (ref 0.350–4.500)

## 2024-11-25 LAB — PHOSPHORUS: Phosphorus: 2.4 mg/dL — ABNORMAL LOW (ref 2.5–4.6)

## 2024-11-25 LAB — OSMOLALITY: Osmolality: 298 mosm/kg — ABNORMAL HIGH (ref 275–295)

## 2024-11-25 MED ORDER — ONDANSETRON HCL 4 MG/2ML IJ SOLN
4.0000 mg | Freq: Four times a day (QID) | INTRAMUSCULAR | Status: DC | PRN
  Administered 2024-11-26: 4 mg via INTRAVENOUS
  Filled 2024-11-25: qty 2

## 2024-11-25 MED ORDER — SODIUM CHLORIDE 0.9 % IV SOLN: 250.0000 mL | INTRAVENOUS | Status: AC | PRN

## 2024-11-25 MED ORDER — SODIUM CHLORIDE 0.9% FLUSH
3.0000 mL | Freq: Two times a day (BID) | INTRAVENOUS | Status: DC
  Administered 2024-11-25 – 2024-11-28 (×5): 3 mL via INTRAVENOUS

## 2024-11-25 MED ORDER — POLYETHYLENE GLYCOL 3350 17 G PO PACK: 17.0000 g | PACK | Freq: Every day | ORAL | Status: DC | PRN

## 2024-11-25 MED ORDER — ENOXAPARIN SODIUM 40 MG/0.4ML IJ SOSY
40.0000 mg | PREFILLED_SYRINGE | Freq: Every day | INTRAMUSCULAR | Status: DC
  Administered 2024-11-25 – 2024-11-27 (×3): 40 mg via SUBCUTANEOUS
  Filled 2024-11-25 (×3): qty 0.4

## 2024-11-25 MED ORDER — SODIUM CHLORIDE 0.9% FLUSH: 3.0000 mL | INTRAVENOUS | Status: DC | PRN

## 2024-11-25 MED ORDER — HYDRALAZINE HCL 20 MG/ML IJ SOLN: 10.0000 mg | Freq: Four times a day (QID) | INTRAMUSCULAR | Status: DC | PRN

## 2024-11-25 MED ORDER — INSULIN REGULAR(HUMAN) IN NACL 100-0.9 UT/100ML-% IV SOLN
INTRAVENOUS | Status: DC
  Administered 2024-11-25: 7 [IU]/h via INTRAVENOUS
  Administered 2024-11-26: 3.8 [IU]/h via INTRAVENOUS
  Filled 2024-11-25 (×2): qty 100

## 2024-11-25 MED ORDER — DEXTROSE IN LACTATED RINGERS 5 % IV SOLN
INTRAVENOUS | Status: AC
  Administered 2024-11-25: 1000 mL via INTRAVENOUS

## 2024-11-25 MED ORDER — CHLORHEXIDINE GLUCONATE CLOTH 2 % EX PADS
6.0000 | MEDICATED_PAD | Freq: Every day | CUTANEOUS | Status: DC
  Administered 2024-11-26 – 2024-11-28 (×2): 6 via TOPICAL

## 2024-11-25 MED ORDER — DEXTROSE 50 % IV SOLN: 0.0000 mL | INTRAVENOUS | Status: DC | PRN

## 2024-11-25 MED ORDER — LACTATED RINGERS IV BOLUS
2000.0000 mL | Freq: Once | INTRAVENOUS | Status: AC
  Administered 2024-11-25: 2000 mL via INTRAVENOUS

## 2024-11-25 MED ORDER — ONDANSETRON HCL 4 MG PO TABS: 4.0000 mg | ORAL_TABLET | Freq: Four times a day (QID) | ORAL | Status: DC | PRN

## 2024-11-25 MED ORDER — LACTATED RINGERS IV SOLN: INTRAVENOUS | Status: AC

## 2024-11-25 NOTE — ED Triage Notes (Addendum)
 Pt reports htn , x 1 week, takes meds, might have missed a dose or two, pt also c/o leg cramping. Pt also endorses HA, dizziness denies changes in visions

## 2024-11-25 NOTE — ED Provider Notes (Signed)
 " [place] EMERGENCY DEPARTMENT AT [facility] Provider Note   CSN: [id number] Arrival date & time: 11/25/24  1623     Patient presents with: Hypertension   Tara Barry is a 59 y.o. female.   59 year old female with a past medical history of high blood pressure currently on dual therapy but only taking one of the medications presents to the ED from PCPs office due to hyperglycemia.  According to patient she has had some polyphasia, polydipsia, polyuria for the past 2 months, followed up with her PCP today due to changes in her vision seen blurry along with leg cramping.  She did see her PCP in the month of December, she is unaware whether she has had a prior A1c done in the past.  She tells me that she is felt somewhat fatigued and very thirsty.  She has not taken any medicine for improvement in her symptoms.  She continues to endorse bilateral leg cramping.  She does have a prior history of tobacco use however has not smoked in several months.  On arrival to the emergency department patient's blood sugar was 931, no prior history of diabetes.  She denies any prior history of heart disease, diabetes, any other source of infection or fevers. Patient last oral intake was around 2 PM this evening consisting of chicken dumplings.  The history is provided by the patient.  Hypertension This is a recurrent problem. The current episode started more than 2 days ago. The problem occurs constantly. The problem has not changed since onset.Pertinent negatives include no chest pain, no abdominal pain and no shortness of breath.       Prior to Admission medications  Medication Sig Start Date End Date Taking? Authorizing Provider  DM-Doxylamine-Acetaminophen (NYQUIL COLD & FLU PO) Take 30 mLs by mouth every 4 (four) hours as needed (headache).    [provider]  ibuprofen  (ADVIL ,MOTRIN ) 600 MG tablet Take 1 tablet (600 mg total) by mouth every 8 (eight) hours as needed. Patient not  taking: Reported on 12/29/2016 06/06/15   Baxter Drivers, MD  meloxicam  (MOBIC ) 15 MG tablet Take 1 tablet (15 mg total) by mouth daily. Take 1 daily with food. 12/29/16   Harris, Abigail, PA-C  Pseudoephedrine-Ibuprofen  (ADVIL  COLD/SINUS PO) Take 1 tablet by mouth every 4 (four) hours as needed (cold symptoms).    [provider]    Allergies: Tylenol [acetaminophen]    Review of Systems  Constitutional:  Negative for chills and fever.  Respiratory:  Negative for shortness of breath.   Cardiovascular:  Negative for chest pain and leg swelling.  Gastrointestinal:  Negative for abdominal pain, diarrhea, nausea and vomiting.  Endocrine: Positive for polydipsia, polyphagia and polyuria.  Musculoskeletal:  Positive for myalgias.  All other systems reviewed and are negative.   Updated Vital Signs BP (!) 156/90   Pulse 71   Temp 98.4 F (36.9 C) (Oral)   Resp 18   SpO2 99%   Physical Exam Vitals and nursing note reviewed.  Constitutional:      General: She is not in acute distress.    Appearance: She is well-developed.  HENT:     Head: Normocephalic and atraumatic.     Mouth/Throat:     Pharynx: No oropharyngeal exudate.  Eyes:     Pupils: Pupils are equal, round, and reactive to light.  Cardiovascular:     Rate and Rhythm: Regular rhythm.     Heart sounds: Normal heart sounds.  Pulmonary:  Effort: Pulmonary effort is normal. No respiratory distress.     Breath sounds: Normal breath sounds.  Abdominal:     General: Bowel sounds are normal. There is no distension.     Palpations: Abdomen is soft.     Tenderness: There is no abdominal tenderness.  Musculoskeletal:        General: No tenderness or deformity.     Cervical back: Normal range of motion.     Right lower leg: No edema.     Left lower leg: No edema.  Skin:    General: Skin is warm and dry.  Neurological:     Mental Status: She is alert and oriented to person, place, and time.     (all labs ordered  are listed, but only abnormal results are displayed) Labs Reviewed  COMPREHENSIVE METABOLIC PANEL WITH GFR - Abnormal; Notable for the following components:      Result Value   Sodium 121 (*)    Chloride 85 (*)    Glucose, Bld 931 (*)    Creatinine, Ser 1.24 (*)    Total Protein 8.2 (*)    ALT 53 (*)    Alkaline Phosphatase 257 (*)    GFR, Estimated 50 (*)    All other components within normal limits  URINALYSIS, ROUTINE W REFLEX MICROSCOPIC - Abnormal; Notable for the following components:   Color, Urine COLORLESS (*)    Glucose, UA >=500 (*)    Bacteria, UA RARE (*)    All other components within normal limits  BLOOD GAS, VENOUS - Abnormal; Notable for the following components:   pO2, Ven <31 (*)    Bicarbonate 33.0 (*)    Acid-Base Excess 6.0 (*)    All other components within normal limits  CBG MONITORING, ED - Abnormal; Notable for the following components:   Glucose-Capillary >600 (*)    All other components within normal limits  CBG MONITORING, ED - Abnormal; Notable for the following components:   Glucose-Capillary >600 (*)    All other components within normal limits  CBG MONITORING, ED - Abnormal; Notable for the following components:   Glucose-Capillary 510 (*)    All other components within normal limits  CBC WITH DIFFERENTIAL/PLATELET  BASIC METABOLIC PANEL WITH GFR  OSMOLALITY    EKG: EKG Interpretation Date/Time:  Thursday November 25 2024 18:23:44 EST Ventricular Rate:  68 PR Interval:  142 QRS Duration:  91 QT Interval:  405 QTC Calculation: 431 R Axis:   17  Text Interpretation: Sinus rhythm Abnormal R-wave progression, early transition No previous ECGs available Confirmed by Patt Alm DEL [phone]) on 11/25/2024 6:45:35 PM  Radiology: DG Chest 1 View Result Date: 11/25/2024 EXAM: 1 VIEW(S) XRAY OF THE CHEST 11/25/2024 06:34:00 PM COMPARISON: None available. CLINICAL HISTORY: SOB (shortness of breath) FINDINGS: LUNGS AND PLEURA: No focal pulmonary  opacity. No pleural effusion. No pneumothorax. HEART AND MEDIASTINUM: Atherosclerotic calcifications. No acute abnormality of the cardiac and mediastinal silhouettes. BONES AND SOFT TISSUES: Multilevel degenerative changes of spine. IMPRESSION: 1. No acute cardiopulmonary abnormality. Electronically signed by: Morgane Naveau MD 11/25/2024 06:38 PM EST RP Workstation: [id number]     .Critical Care  Performed by: Tenleigh Byer, PA-C Authorized by: Lamonica Trueba, PA-C   Critical care provider statement:    Critical care time (minutes):  30   Critical care start time:  11/25/2024 6:00 PM   Critical care end time:  11/25/2024 6:26 PM   Critical care was necessary to treat or prevent imminent or life-threatening deterioration  of the following conditions:  Endocrine crisis   Critical care was time spent personally by me on the following activities:  Development of treatment plan with patient or surrogate, discussions with consultants, evaluation of patient's response to treatment, examination of patient, ordering and review of laboratory studies, ordering and review of radiographic studies, ordering and performing treatments and interventions, pulse oximetry, re-evaluation of patient's condition and review of old charts    Medications Ordered in the ED  insulin  regular, human (MYXREDLIN ) 100 units/ 100 mL infusion (7 Units/hr Intravenous New Bag/Given 11/25/24 1851)  lactated ringers  infusion (0 mLs Intravenous Hold 11/25/24 1938)  dextrose  5 % in lactated ringers  infusion (0 mLs Intravenous Hold 11/25/24 1831)  dextrose  50 % solution 0-50 mL (has no administration in time range)  lactated ringers  bolus 2,000 mL (2,000 mLs Intravenous New Bag/Given 11/25/24 1853)    Clinical Course as of 11/25/24 2017  Thu Nov 25, 2024  1824 Glucose(!!): 931 [JS]  1824 Sodium(!): 121 Decreased from baseline [JS]  1824 Creatinine(!): 1.24 New AKI [JS]    Clinical Course User Index [JS] Nalani Andreen, PA-C                                  Medical Decision Making Amount and/or Complexity of Data Reviewed Labs: ordered. Decision-making details documented in ED Course. Radiology: ordered.  Risk Prescription drug management.   This patient presents to the ED for concern of hyperglycemia, this involves a number of treatment options, and is a complaint that carries with it a high risk of complications and morbidity.  The differential diagnosis includes new onset diabetes, DKA, versus infection.    Co morbidities: Discussed in HPI   Brief History:  See HPI.   EMR reviewed including pt PMHx, past surgical history and past visits to ER.   See HPI for more details   Lab Tests:  I ordered and independently interpreted labs.  The pertinent results include:    CBC with no leukocytosis, hemoglobin is within normal limits.  CMP remarkable for some hyponatremia, could be attributing to some of her leg cramping.  Glucose level is 931.  ALT is elevated at 53, along with alk phos.  She is not endorsing any abdominal pain at this time.  CBG on arrival is greater than 600.   Imaging Studies:  NAD. I personally reviewed all imaging studies and no acute abnormality found. I agree with radiology interpretation.  Cardiac Monitoring:  The patient was maintained on a cardiac monitor.  I personally viewed and interpreted the cardiac monitored which showed an underlying rhythm of:NSR 68 EKG non-ischemic   Medicines ordered:  I ordered medication including bolus  for hyperglycemia Reevaluation of the patient after these medicines showed that the patient improved I have reviewed the patients home medicines and have made adjustments as needed   Critical Interventions:  Patient started on insulin  due to glucose of 900.  Reevaluation:  After the interventions noted above I re-evaluated patient and found that they have :stayed the same   Social Determinants of Health:  The patient's social determinants  of health were a factor in the care of this patient   Problem List / ED Course:  Patient presented to the ED with a chief complaint of polydipsia, polyuria, polyphasia for the past 2 months.  Following up with PCP but has never had an A1c.  Found to be hyperglycemic at the office today when  she was following up for blood pressure control.  Reports that she has felt some tingling to her lower and upper extremities.  She does have a sodium of 121 on today's visit, denies any history of alcohol use.  Glucose is 931 today.  She does not have any anion gap, according to labs at this moment she is not in DKA.  She has not had any source of infection to contribute to this event of hyperglycemia.  She does not have any prior history of prior diabetes until today. Tells me she discontinued tobacco use, has been on the same medicines over the past 2 months however only takes one of her medicine for blood pressure control.  Her CBC with no leukocytosis.  Her chest x-ray without any signs of infection.  She was given 2 L of LR, also started on insulin , I do feel that patient warrants admission at this time for further management of her hyperglycemia. I spoke to Dr. Ryan, hospital service who will admit patient for further management.  Patient remains hemodynamically stable for admission.   Dispostion:  After consideration of the diagnostic results and the patients response to treatment, I feel that the patent would benefit from admission for further control of hyperglycemia.    Portions of this note were generated with Scientist, clinical (histocompatibility and immunogenetics). Dictation errors may occur despite best attempts at proofreading.   Final diagnoses:  Hyperglycemia    ED Discharge Orders     None          Lyle Niblett, PA-C 11/25/24 2017    Patt Alm Macho, MD 11/25/24 2221  "

## 2024-11-25 NOTE — ED Notes (Signed)
 Pt. CBG reading critical HIGH on glucometer. RN Shaun made aware.

## 2024-11-25 NOTE — H&P (Signed)
 " [facility] History and Physical  Tara Barry FMW:[id number] DOB: [birth year] DOA: 11/25/2024  Referring physician: ED  PCP: Patient, No Pcp Per   Patient is coming from: Home  Chief Complaint: High blood glucose level  HPI:  59 year old female with past medical history of hypertension presented to hospital from primary care office with complaints of hyper glycemia.  Of note, patient has been having polyphagia, polydipsia and polyuria for the last 2 months or so and subsequently started developing blurry vision with leg cramping so she went to PCP today.  She was also very fatigued and tired.  Patient states that she has some dizziness lightheadedness but has not passed out.  Denies any chest pain, fever or chills but has occasional shortness of breath.  She admits to losing almost 13 pounds of weight over the same duration.  Denies any nausea vomiting or diarrhea.  Denies any urinary burning sensation but has polyuria.  Patient stated that she has been seeing her primary care physician for a while but has not been informed about her diabetes yet.  PCP noted that her blood glucose levels were high so was referred to the hospital.  In the ED, patient was noted to be hypertensive with blood pressure of 146/106.  Lab was notable for glucose level of 931 but with no anion gap.  CBC within normal limits sodium was 121 likely due to hyperglycemia.  Urinalysis showed glycosuria.  Chest x-ray showed no acute cardiopulmonary abnormality.  Patient was started on insulin  drip after 2 L of LR IV fluid bolus and was  then considered for admission to the hospital for further evaluation and treatment.   Assessment and Plan Principal Problem:   Hyperosmolar hyperglycemic state (HHS) (HCC) Active Problems:   Hypertension  Hyperosmolar hyperglycemic state  Likely new onset diabetes.  No previous history of diabetes or A1c done in the past.  Patient received IV fluid bolus and is on insulin  drip at this  time.  Check hemoglobin A1c, consult diabetic coordinator, will likely need insulin  teaching.  Accelerated hypertension.  Patient was supposed to be on 2 different antihypertensives at home but was taking only 1.  Will put the patient on IV hydralazine  for now.  Elevated creatinine.  Creatinine on presentation at 1.1.  Creatinine 5 years back was 0.7.  Uncertain whether this represents AKI or not.  Will need to continue to monitor.  Continue hydration.  History of smoking cigarettes and marijuana.  Quit 6 months back.  Pseudohyponatremia secondary to hyperglycemia.  Will continue to monitor closely with BMP.  DVT Prophylaxis: Lovenox  subcu  Review of Systems:  All systems were reviewed and were negative unless otherwise mentioned in the HPI   Past Medical History:  Diagnosis Date   Bell's palsy    Hypertension    Kidney stone    Past Surgical History:  Procedure Laterality Date   BREAST BIOPSY Right 10/21/2024   US  RT BREAST BX W LOC DEV 1ST LESION IMG BX SPEC US  GUIDE 10/21/2024 [facility]   BREAST BIOPSY Right 10/21/2024   US  RT BREAST BX W LOC DEV EA ADD LESION IMG BX SPEC US  GUIDE 10/21/2024 [facility]    Social History:  reports that she quit smoking about 9 years ago. Her smoking use included cigarettes. She has never used smokeless tobacco. She reports current alcohol use. She reports that she does not use drugs.  Allergies[1]  Family History  Problem Relation Age of Onset   Breast cancer Neg  Hx      Prior to Admission medications  Medication Sig Start Date End Date Taking? Authorizing Provider  DM-Doxylamine-Acetaminophen (NYQUIL COLD & FLU PO) Take 30 mLs by mouth every 4 (four) hours as needed (headache).    [provider]  ibuprofen  (ADVIL ,MOTRIN ) 600 MG tablet Take 1 tablet (600 mg total) by mouth every 8 (eight) hours as needed. Patient not taking: Reported on 12/29/2016 06/06/15   Baxter Drivers, MD  meloxicam  (MOBIC ) 15 MG tablet Take  1 tablet (15 mg total) by mouth daily. Take 1 daily with food. 12/29/16   Harris, Abigail, PA-C  Pseudoephedrine-Ibuprofen  (ADVIL  COLD/SINUS PO) Take 1 tablet by mouth every 4 (four) hours as needed (cold symptoms).    [provider]    Physical Exam:  Vitals:   11/25/24 1915 11/25/24 1945 11/25/24 2000 11/25/24 2015  BP: (!) 156/90 (!) 155/88 (!) 154/74 (!) 152/88  Pulse: 71 68 (!) 58 63  Resp: 18 16 13 20   Temp:      TempSrc:      SpO2: 99% 99% 99% 99%   Wt Readings from Last 3 Encounters:  12/29/16 63.5 kg   There is no height or weight on file to calculate BMI.  General:  Average built, not in obvious distress, thinly built HENT: Normocephalic, No scleral pallor or icterus noted. Oral mucosa is moist.  Chest:  Clear breath sounds.  . No crackles or wheezes.  CVS: S1 &S2 heard. No murmur.  Regular rate and rhythm. Abdomen: Soft, nontender, nondistended.  Bowel sounds are heard. No abdominal mass palpated Extremities: No cyanosis, clubbing or edema.  Peripheral pulses are palpable. Psych: Alert, awake and oriented, normal mood CNS:  No cranial nerve deficits.  Power equal in all extremities.   Skin: Warm and dry.  No rashes noted.  Labs on Admission:   CBC: Recent Labs  Lab 11/25/24 1650  WBC 4.8  NEUTROABS 2.2  HGB 13.4  HCT 38.8  MCV 88.0  PLT 196    Basic Metabolic Panel: Recent Labs  Lab 11/25/24 1650 11/25/24 1830  NA 121* 120*  K 4.7 4.2  CL 85* 85*  CO2 23 21*  GLUCOSE 931* 824*  BUN 15 14  CREATININE 1.24* 1.18*  CALCIUM 9.6 9.6    Liver Function Tests: Recent Labs  Lab 11/25/24 1650  AST 36  ALT 53*  ALKPHOS 257*  BILITOT 0.5  PROT 8.2*  ALBUMIN 3.8   No results for input(s): LIPASE, AMYLASE in the last 168 hours. No results for input(s): AMMONIA in the last 168 hours.  Cardiac Enzymes: No results for input(s): CKTOTAL, CKMB, CKMBINDEX, TROPONINI in the last 168 hours.  BNP (last 3 results) No results for  input(s): BNP in the last 8760 hours.  ProBNP (last 3 results) No results for input(s): PROBNP in the last 8760 hours.  CBG: Recent Labs  Lab 11/25/24 1817 11/25/24 1928 11/25/24 2014  GLUCAP >600* >600* 510*    Lipase  No results found for: LIPASE   Urinalysis    Component Value Date/Time   COLORURINE COLORLESS (A) 11/25/2024 1859   APPEARANCEUR CLEAR 11/25/2024 1859   LABSPEC 1.026 11/25/2024 1859   PHURINE 6.0 11/25/2024 1859   GLUCOSEU >=500 (A) 11/25/2024 1859   HGBUR NEGATIVE 11/25/2024 1859   BILIRUBINUR NEGATIVE 11/25/2024 1859   KETONESUR NEGATIVE 11/25/2024 1859   PROTEINUR NEGATIVE 11/25/2024 1859   UROBILINOGEN 0.2 06/06/2015 0724   NITRITE NEGATIVE 11/25/2024 1859   LEUKOCYTESUR NEGATIVE 11/25/2024 1859  Drugs of Abuse  No results found for: LABOPIA, COCAINSCRNUR, LABBENZ, AMPHETMU, THCU, LABBARB    Radiological Exams on Admission: DG Chest 1 View Result Date: 11/25/2024 EXAM: 1 VIEW(S) XRAY OF THE CHEST 11/25/2024 06:34:00 PM COMPARISON: None available. CLINICAL HISTORY: SOB (shortness of breath) FINDINGS: LUNGS AND PLEURA: No focal pulmonary opacity. No pleural effusion. No pneumothorax. HEART AND MEDIASTINUM: Atherosclerotic calcifications. No acute abnormality of the cardiac and mediastinal silhouettes. BONES AND SOFT TISSUES: Multilevel degenerative changes of spine. IMPRESSION: 1. No acute cardiopulmonary abnormality. Electronically signed by: Morgane Naveau MD 11/25/2024 06:38 PM EST RP Workstation: [id number]    EKG: Personally reviewed by me which shows normal sinus rhythm   Consultant: None  Code Status: Full code  Microbiology none  Antibiotics: None  Family Communication:  Patients' condition and plan of care including tests being ordered have been discussed with the patient and the patient's spouse at bedside who indicate understanding and agree with the plan.   Status is: Observation The patient remains OBS  appropriate and will d/c before 2 midnights.   Severity of Illness: The appropriate patient status for this patient is OBSERVATION. Observation status is judged to be reasonable and necessary in order to provide the required intensity of service to ensure the patient's safety. The patient's presenting symptoms, physical exam findings, and initial radiographic and laboratory data in the context of their medical condition is felt to place them at decreased risk for further clinical deterioration. Furthermore, it is anticipated that the patient will be medically stable for discharge from the hospital within 2 midnights of admission.   Signed, Vernal Alstrom, MD [facility] 11/25/2024        [1]  Allergies Allergen Reactions   Tylenol [Acetaminophen] Nausea And Vomiting   "

## 2024-11-25 NOTE — Hospital Course (Addendum)
 SABRA

## 2024-11-25 NOTE — ED Notes (Signed)
 Pt. CBG 510, RN made aware.

## 2024-11-25 NOTE — ED Notes (Signed)
 No change in Insulin  rate per Endotool

## 2024-11-26 ENCOUNTER — Other Ambulatory Visit: Payer: Self-pay

## 2024-11-26 DIAGNOSIS — E1100 Type 2 diabetes mellitus with hyperosmolarity without nonketotic hyperglycemic-hyperosmolar coma (NKHHC): Secondary | ICD-10-CM | POA: Diagnosis not present

## 2024-11-26 DIAGNOSIS — E119 Type 2 diabetes mellitus without complications: Secondary | ICD-10-CM

## 2024-11-26 DIAGNOSIS — R7989 Other specified abnormal findings of blood chemistry: Secondary | ICD-10-CM

## 2024-11-26 DIAGNOSIS — E876 Hypokalemia: Secondary | ICD-10-CM | POA: Diagnosis not present

## 2024-11-26 DIAGNOSIS — I10 Essential (primary) hypertension: Secondary | ICD-10-CM

## 2024-11-26 LAB — BASIC METABOLIC PANEL WITH GFR
Anion gap: 10 (ref 5–15)
Anion gap: 9 (ref 5–15)
BUN: 10 mg/dL (ref 6–20)
BUN: 8 mg/dL (ref 6–20)
CO2: 24 mmol/L (ref 22–32)
CO2: 25 mmol/L (ref 22–32)
Calcium: 9.1 mg/dL (ref 8.9–10.3)
Calcium: 9 mg/dL (ref 8.9–10.3)
Chloride: 98 mmol/L (ref 98–111)
Chloride: 100 mmol/L (ref 98–111)
Creatinine, Ser: 0.71 mg/dL (ref 0.44–1.00)
Creatinine, Ser: 0.7 mg/dL (ref 0.44–1.00)
GFR, Estimated: >60 mL/min
GFR, Estimated: >60 mL/min
Glucose, Bld: 241 mg/dL — ABNORMAL HIGH (ref 70–99)
Glucose, Bld: 253 mg/dL — ABNORMAL HIGH (ref 70–99)
Potassium: 3.1 mmol/L — ABNORMAL LOW (ref 3.5–5.1)
Potassium: 3.4 mmol/L — ABNORMAL LOW (ref 3.5–5.1)
Sodium: 133 mmol/L — ABNORMAL LOW (ref 135–145)
Sodium: 134 mmol/L — ABNORMAL LOW (ref 135–145)

## 2024-11-26 LAB — GLUCOSE, CAPILLARY
Glucose-Capillary: 162 mg/dL — ABNORMAL HIGH (ref 70–99)
Glucose-Capillary: 164 mg/dL — ABNORMAL HIGH (ref 70–99)
Glucose-Capillary: 180 mg/dL — ABNORMAL HIGH (ref 70–99)
Glucose-Capillary: 180 mg/dL — ABNORMAL HIGH (ref 70–99)
Glucose-Capillary: 181 mg/dL — ABNORMAL HIGH (ref 70–99)
Glucose-Capillary: 204 mg/dL — ABNORMAL HIGH (ref 70–99)
Glucose-Capillary: 204 mg/dL — ABNORMAL HIGH (ref 70–99)
Glucose-Capillary: 205 mg/dL — ABNORMAL HIGH (ref 70–99)
Glucose-Capillary: 233 mg/dL — ABNORMAL HIGH (ref 70–99)
Glucose-Capillary: 236 mg/dL — ABNORMAL HIGH (ref 70–99)
Glucose-Capillary: 245 mg/dL — ABNORMAL HIGH (ref 70–99)
Glucose-Capillary: 245 mg/dL — ABNORMAL HIGH (ref 70–99)
Glucose-Capillary: 246 mg/dL — ABNORMAL HIGH (ref 70–99)
Glucose-Capillary: 256 mg/dL — ABNORMAL HIGH (ref 70–99)
Glucose-Capillary: 269 mg/dL — ABNORMAL HIGH (ref 70–99)
Glucose-Capillary: 276 mg/dL — ABNORMAL HIGH (ref 70–99)
Glucose-Capillary: 295 mg/dL — ABNORMAL HIGH (ref 70–99)
Glucose-Capillary: 302 mg/dL — ABNORMAL HIGH (ref 70–99)

## 2024-11-26 LAB — HEMOGLOBIN A1C
Hgb A1c MFr Bld: 13.3 % — ABNORMAL HIGH (ref 4.8–5.6)
Mean Plasma Glucose: 335.01 mg/dL

## 2024-11-26 LAB — CBC
HCT: 32.4 % — ABNORMAL LOW (ref 36.0–46.0)
Hemoglobin: 11.5 g/dL — ABNORMAL LOW (ref 12.0–15.0)
MCH: 30.3 pg (ref 26.0–34.0)
MCHC: 35.5 g/dL (ref 30.0–36.0)
MCV: 85.5 fL (ref 80.0–100.0)
Platelets: 178 K/uL (ref 150–400)
RBC: 3.79 MIL/uL — ABNORMAL LOW (ref 3.87–5.11)
RDW: 11.7 % (ref 11.5–15.5)
WBC: 5.1 K/uL (ref 4.0–10.5)
nRBC: 0 % (ref 0.0–0.2)

## 2024-11-26 LAB — MAGNESIUM: Magnesium: 1.9 mg/dL (ref 1.7–2.4)

## 2024-11-26 LAB — HIV ANTIBODY (ROUTINE TESTING W REFLEX): HIV Screen 4th Generation wRfx: NONREACTIVE

## 2024-11-26 LAB — MRSA NEXT GEN BY PCR, NASAL: MRSA by PCR Next Gen: NOT DETECTED

## 2024-11-26 MED ORDER — INSULIN STARTER KIT- PEN NEEDLES (ENGLISH)
1.0000 | Freq: Once | Status: AC
  Administered 2024-11-26: 1
  Filled 2024-11-26: qty 1

## 2024-11-26 MED ORDER — POTASSIUM CHLORIDE 10 MEQ/100ML IV SOLN
10.0000 meq | INTRAVENOUS | Status: AC
  Administered 2024-11-26 (×3): 10 meq via INTRAVENOUS
  Filled 2024-11-26 (×3): qty 100

## 2024-11-26 MED ORDER — POTASSIUM CHLORIDE CRYS ER 20 MEQ PO TBCR
40.0000 meq | EXTENDED_RELEASE_TABLET | Freq: Once | ORAL | Status: AC
  Administered 2024-11-26: 40 meq via ORAL
  Filled 2024-11-26: qty 2

## 2024-11-26 MED ORDER — INSULIN ASPART 100 UNIT/ML IJ SOLN
0.0000 [IU] | Freq: Every day | INTRAMUSCULAR | Status: DC
  Administered 2024-11-26: 4 [IU] via SUBCUTANEOUS
  Administered 2024-11-27: 3 [IU] via SUBCUTANEOUS
  Filled 2024-11-26: qty 3
  Filled 2024-11-26: qty 4

## 2024-11-26 MED ORDER — INSULIN ASPART 100 UNIT/ML IJ SOLN
0.0000 [IU] | Freq: Three times a day (TID) | INTRAMUSCULAR | Status: DC
  Administered 2024-11-26: 5 [IU] via SUBCUTANEOUS
  Administered 2024-11-27: 3 [IU] via SUBCUTANEOUS
  Administered 2024-11-27 – 2024-11-28 (×3): 7 [IU] via SUBCUTANEOUS
  Administered 2024-11-28: 5 [IU] via SUBCUTANEOUS
  Filled 2024-11-26: qty 3
  Filled 2024-11-26: qty 5
  Filled 2024-11-26 (×3): qty 7
  Filled 2024-11-26: qty 5

## 2024-11-26 MED ORDER — INSULIN ASPART 100 UNIT/ML IJ SOLN
4.0000 [IU] | Freq: Three times a day (TID) | INTRAMUSCULAR | Status: DC
  Administered 2024-11-26 – 2024-11-27 (×2): 4 [IU] via SUBCUTANEOUS
  Filled 2024-11-26: qty 4

## 2024-11-26 MED ORDER — INSULIN ASPART 100 UNIT/ML IJ SOLN: 0.0000 [IU] | Freq: Three times a day (TID) | INTRAMUSCULAR | Status: DC

## 2024-11-26 MED ORDER — INSULIN GLARGINE-YFGN 100 UNIT/ML ~~LOC~~ SOLN
10.0000 [IU] | Freq: Every day | SUBCUTANEOUS | Status: DC
  Administered 2024-11-26: 10 [IU] via SUBCUTANEOUS
  Filled 2024-11-26 (×2): qty 0.1

## 2024-11-26 MED ORDER — ORAL CARE MOUTH RINSE: 15.0000 mL | OROMUCOSAL | Status: DC | PRN

## 2024-11-26 MED ORDER — LIVING WELL WITH DIABETES BOOK
Freq: Once | Status: AC
  Administered 2024-11-26: 1
  Filled 2024-11-26: qty 1

## 2024-11-26 MED ORDER — INSULIN ASPART 100 UNIT/ML IJ SOLN
0.0000 [IU] | Freq: Three times a day (TID) | INTRAMUSCULAR | Status: DC
  Administered 2024-11-26: 3 [IU] via SUBCUTANEOUS
  Filled 2024-11-26: qty 3

## 2024-11-26 NOTE — Progress Notes (Signed)
 Semglee  administered at 1250 per orders. Insulin  gtt and associated fluids stopped at 1509 per protocol. SSI administered per protocol. RN will continue to closely monitor CBGs.

## 2024-11-26 NOTE — Progress Notes (Addendum)
 " Progress Note   Patient: Tara Barry FMW:[id number] DOB: [birth year] DOA: 11/25/2024     0 DOS: the patient was seen and examined on 11/26/2024   Brief hospital course: 59 year old female with past medical history of hypertension presented to hospital from primary care office with complaints of hyperglycemia. She has been having polyphagia, polydipsia and polyuria for the last 2 months or so and subsequently started developing blurry vision with leg cramping went to see her PCP, where her blood sugars very high advised to come to ED.   In the ED, labs showed Na 120, sugars 824, cr 1.18, gap 14, co2 21 admitted to TRH service for HHS due to new diabetes diagnosis.  Assessment and Plan: Hyperosmolar hyperglycemic state New onset type 2 diabetes- A1c noted 11.3. Sugars improved now with IV insulin , transitioned to SQ insulin  regimen. Lantus  10 unit with 4 units novolog  tid and sliding scale. She will need diet, diabetes management education. She will be sent home on insulin  regimen, possible CGM.  Pseudohyponatremia- Due to high blood sugars. Improved.  Hypertension- Resume home  Continue IV hydralazine  as needed.  Mild AKI- Improved with fluids. Monitor daily renal function.  Hypokalemia- IV kcl ordered. Monitor daily electrolytes and replete accordingly.    Out of bed to chair. Incentive spirometry. Nursing supportive care. Fall, aspiration precautions. Diet:  Diet Orders (From admission, onward)     Start     Ordered   11/26/24 1216  Diet heart healthy/carb modified Room service appropriate? Yes; Fluid consistency: Thin  Diet effective now       Question Answer Comment  Diet-HS Snack? Nothing   Room service appropriate? Yes   Fluid consistency: Thin      11/26/24 1215           DVT prophylaxis: enoxaparin  (LOVENOX ) injection 40 mg Start: 11/25/24 2200  Level of care: Stepdown   Code Status: Full Code  Subjective: Patient is seen and examined today morning.  She is weak, wishes to eat. No nausea or abdominal pain. Her IV burning from iv potassium. Husband at bedside.  Physical Exam: Vitals:   11/26/24 0900 11/26/24 1000 11/26/24 1100 11/26/24 1200  BP: (!) 142/53 (!) 133/23 135/79 128/84  Pulse: (!) 51 (!) 53 (!) 52 61  Resp: 13  18 18   Temp:      TempSrc:      SpO2: 95% 97% 97% 98%  Weight:      Height:        General - Middle aged African American female, no distress HEENT - PERRLA, EOMI, atraumatic head, non tender sinuses. Lung - Clear, no rales, rhonchi, wheezes. Heart - S1, S2 heard, no murmurs, rubs, trace pedal edema. Abdomen - Soft, non tender, bowel sounds good Neuro - Alert, awake and oriented x 3, non focal exam. Skin - Warm and dry.  Data Reviewed:      Latest Ref Rng & Units 11/26/2024    5:25 AM 11/25/2024    4:50 PM 06/06/2015    8:19 AM  CBC  WBC 4.0 - 10.5 K/uL 5.1  4.8  5.6   Hemoglobin 12.0 - 15.0 g/dL 88.4  86.5  87.2   Hematocrit 36.0 - 46.0 % 32.4  38.8  36.7   Platelets 150 - 400 K/uL 178  196  252       Latest Ref Rng & Units 11/26/2024    5:25 AM 11/26/2024   12:39 AM 11/25/2024    8:16 PM  BMP  Glucose  70 - 99 mg/dL 746  758  463   BUN 6 - 20 mg/dL 8  10  12    Creatinine 0.44 - 1.00 mg/dL 9.29  9.28  9.02   Sodium 135 - 145 mmol/L 134  133  126   Potassium 3.5 - 5.1 mmol/L 3.4  3.1  3.8   Chloride 98 - 111 mmol/L 100  98  92   CO2 22 - 32 mmol/L 25  24  21    Calcium 8.9 - 10.3 mg/dL 9.0  9.1  9.4    DG Chest 1 View Result Date: 11/25/2024 EXAM: 1 VIEW(S) XRAY OF THE CHEST 11/25/2024 06:34:00 PM COMPARISON: None available. CLINICAL HISTORY: SOB (shortness of breath) FINDINGS: LUNGS AND PLEURA: No focal pulmonary opacity. No pleural effusion. No pneumothorax. HEART AND MEDIASTINUM: Atherosclerotic calcifications. No acute abnormality of the cardiac and mediastinal silhouettes. BONES AND SOFT TISSUES: Multilevel degenerative changes of spine. IMPRESSION: 1. No acute cardiopulmonary abnormality.  Electronically signed by: Morgane Naveau MD 11/25/2024 06:38 PM EST RP Workstation: [id number]    Family Communication: Discussed with patient, understand and agree. All questions answered.  Disposition: Status is: Observation The patient remains OBS appropriate and will d/c before 2 midnights.  Planned Discharge Destination: Home     Time spent: 44 minutes  Author: Concepcion Riser, MD 11/26/2024 12:16 PM Secure chat 7am to 7pm For on call review [email or web address]    "

## 2024-11-26 NOTE — Inpatient Diabetes Management (Addendum)
 Inpatient Diabetes Program Recommendations  AACE/ADA: New Consensus Statement on Inpatient Glycemic Control (2015)  Target Ranges:  Prepandial:   less than 140 mg/dL      Peak postprandial:   less than 180 mg/dL (1-2 hours)      Critically ill patients:  140 - 180 mg/dL   Lab Results  Component Value Date   GLUCAP 164 (H) 11/26/2024   HGBA1C 13.3 (H) 11/25/2024    Review of Glycemic Control  Diabetes history: New-onset DM Outpatient Diabetes medications: None Current orders for Inpatient glycemic control: IV insulin  per [facility] for HHS, transitioning to Lantus  10 units daily and Novolog  0-9 TID with meals and 0-5 HS  HgbA1C - 13.3%  Inpatient Diabetes Program Recommendations:    Consider adding Novolog  3-4 units TID if eating > 50%  Spoke with pt about new diagnosis. Discussed A1C results with her and explained what an A1C is, basic pathphysiology of DM, basic home care, importance of checking CBGs and maintaining good glucose control to prevent longand short-term  complicatons.RNs to provide ongoing basic DM education at bedside. Ordered LWWD book, insulin  pen starter kit. RD consult completed. Pt states she has PCP and will f/u in the next week or two.  Educated patient and spouse on insulin  pen use at home. Reviewed contents of insulin  flexpen starter kit. Reviewed all steps of insulin  pen including attachment of needle, 2-unit air shot, dialing up dose, giving injection, removing needle, disposal of sharps, storage of unused insulin , disposal of insulin  etc. Patient able to provide successful return demonstration. Also reviewed troubleshooting with insulin  pen. MD to give patient Rxs for insulin  pens and insulin  pen needles. Interested in [facility]. Pt's husband to download program and will place later this afternoon.  Reviewed hypoglycemia s/s and treatment. Answered all questions/concerns.  Will place DM Discharge Order set by end of day.   Discussed above with RN.   Received  verbal order from MD for placing a Freestyle Libre2 on patient.  Educated patient on use, application, monitoring of blood sugar and when to check a finger stick (if receiver alerts to or if she feels different from receiver reading).  Placed sensor on back of left arm.  Continue to monitor cbg's with finger sticks while inpatient.   Thank you. Shona Brandy, RD, LDN, CDCES Inpatient Diabetes Coordinator [phone]

## 2024-11-26 NOTE — Plan of Care (Signed)
  Problem: Clinical Measurements: Goal: Will remain free from infection Outcome: Progressing Goal: Diagnostic test results will improve Outcome: Progressing Goal: Respiratory complications will improve Outcome: Progressing Goal: Cardiovascular complication will be avoided Outcome: Progressing   Problem: Activity: Goal: Risk for activity intolerance will decrease Outcome: Progressing   Problem: Elimination: Goal: Will not experience complications related to urinary retention Outcome: Progressing   Problem: Pain Managment: Goal: General experience of comfort will improve and/or be controlled Outcome: Progressing

## 2024-11-26 NOTE — Plan of Care (Signed)
 Nutrition Education Note  RD consulted for nutrition education regarding new diagnosis of diabetes.   Lab Results  Component Value Date   HGBA1C 13.3 (H) 11/25/2024   Patient reports typically eating 2 meals a day. Usually has a steak biscuit for breakfast and dinner varies but is often a sub, pizza. Drinks regular soda, juice, and water at home. Admits to a sweet tooth and likes butterscotch hard candies.  RD provided Carbohydrate Counting for People with Diabetes, Using Nutrition Labels: Carbohydrate, and Plate Method for Diabetes handouts from the Academy of [employer] and Dietetics. Discussed different food groups and their effects on blood sugar, emphasizing carbohydrate-containing foods. Provided list of carbohydrates and recommended serving sizes of common foods.  Discussed importance of controlled and consistent carbohydrate intake throughout the day. Provided examples of ways to balance meals/snacks and encouraged intake of high-fiber, whole grain complex carbohydrates. Teach back method used.  Expect fair compliance. Patient agreeable to an outpatient dietitian referral which has been placed.  Body mass index is 25.39 kg/m.   Current diet order is heart healthy/carb modified, just advanced this afternoon. Labs and medications reviewed. No further nutrition interventions warranted at this time. If additional nutrition issues arise, please re-consult RD.  Trude Ned RD, LDN Contact via [employer].

## 2024-11-27 DIAGNOSIS — E876 Hypokalemia: Secondary | ICD-10-CM | POA: Diagnosis not present

## 2024-11-27 DIAGNOSIS — E119 Type 2 diabetes mellitus without complications: Secondary | ICD-10-CM | POA: Diagnosis not present

## 2024-11-27 DIAGNOSIS — I10 Essential (primary) hypertension: Secondary | ICD-10-CM | POA: Diagnosis not present

## 2024-11-27 DIAGNOSIS — E1100 Type 2 diabetes mellitus with hyperosmolarity without nonketotic hyperglycemic-hyperosmolar coma (NKHHC): Secondary | ICD-10-CM | POA: Diagnosis not present

## 2024-11-27 LAB — BASIC METABOLIC PANEL WITH GFR
Anion gap: 10 (ref 5–15)
BUN: 9 mg/dL (ref 6–20)
CO2: 24 mmol/L (ref 22–32)
Calcium: 8.9 mg/dL (ref 8.9–10.3)
Chloride: 103 mmol/L (ref 98–111)
Creatinine, Ser: 0.73 mg/dL (ref 0.44–1.00)
GFR, Estimated: >60 mL/min
Glucose, Bld: 314 mg/dL — ABNORMAL HIGH (ref 70–99)
Potassium: 3.9 mmol/L (ref 3.5–5.1)
Sodium: 137 mmol/L (ref 135–145)

## 2024-11-27 LAB — GLUCOSE, CAPILLARY
Glucose-Capillary: 304 mg/dL — ABNORMAL HIGH (ref 70–99)
Glucose-Capillary: 345 mg/dL — ABNORMAL HIGH (ref 70–99)
Glucose-Capillary: 235 mg/dL — ABNORMAL HIGH (ref 70–99)
Glucose-Capillary: 288 mg/dL — ABNORMAL HIGH (ref 70–99)

## 2024-11-27 MED ORDER — INSULIN ASPART 100 UNIT/ML IJ SOLN
6.0000 [IU] | Freq: Three times a day (TID) | INTRAMUSCULAR | Status: DC
  Administered 2024-11-27 – 2024-11-28 (×4): 6 [IU] via SUBCUTANEOUS
  Filled 2024-11-27 (×4): qty 6

## 2024-11-27 MED ORDER — INSULIN GLARGINE-YFGN 100 UNIT/ML ~~LOC~~ SOLN
18.0000 [IU] | Freq: Every day | SUBCUTANEOUS | Status: DC
  Administered 2024-11-27 – 2024-11-28 (×2): 18 [IU] via SUBCUTANEOUS
  Filled 2024-11-27 (×2): qty 0.18

## 2024-11-27 MED ORDER — AMLODIPINE BESYLATE 5 MG PO TABS
5.0000 mg | ORAL_TABLET | Freq: Every day | ORAL | Status: DC
  Administered 2024-11-27 – 2024-11-28 (×2): 5 mg via ORAL
  Filled 2024-11-27 (×2): qty 1

## 2024-11-27 NOTE — Progress Notes (Signed)
 " Progress Note   Patient: Tara Barry FMW:[id number] DOB: [birth year] DOA: 11/25/2024     0 DOS: the patient was seen and examined on 11/27/2024   Brief hospital course: 59 year old female with past medical history of hypertension presented to hospital from primary care office with complaints of hyperglycemia. She has been having polyphagia, polydipsia and polyuria for the last 2 months or so and subsequently started developing blurry vision with leg cramping went to see her PCP, where her blood sugars very high advised to come to ED.   In the ED, labs showed Na 120, sugars 824, cr 1.18, gap 14, co2 21 admitted to TRH service for HHS due to new diabetes diagnosis.  Assessment and Plan: Hyperosmolar hyperglycemic state New onset type 2 diabetes- A1c noted 11.3. Sugars improved now with IV insulin , transitioned to SQ insulin  regimen 11/26/24. Lantus  increased to 18 unit with 6 units novolog  tid and sliding scale. She got diet, diabetes management and insulin  teaching. She will be sent home on insulin  regimen, possible CGM once sugars better controlled.  Pseudohyponatremia- Due to high blood sugars. Improved.  Hypertension- Resumed home dose norvasc . Continue IV hydralazine  as needed.  Mild AKI- Improved with fluids. Monitor daily renal function.  Hypokalemia-  K improved with IV kcl. Monitor daily electrolytes and replete accordingly.  Out of bed to chair. Incentive spirometry. Nursing supportive care. Fall, aspiration precautions. Diet:  Diet Orders (From admission, onward)     Start     Ordered   11/26/24 1216  Diet heart healthy/carb modified Room service appropriate? Yes; Fluid consistency: Thin  Diet effective now       Question Answer Comment  Diet-HS Snack? Nothing   Room service appropriate? Yes   Fluid consistency: Thin      11/26/24 1215           DVT prophylaxis: enoxaparin  (LOVENOX ) injection 40 mg Start: 11/25/24 2200  Level of care: Stepdown   Code  Status: Full Code  Subjective: Patient is seen and examined today morning. She is feeling better. No nausea or abdominal pain. Advised to get out of bed, work with PT. Husband at bedside.  Physical Exam: Vitals:   11/27/24 0800 11/27/24 0855 11/27/24 1000 11/27/24 1200  BP: (!) 143/76  (!) 177/79 137/66  Pulse: (!) 53  63 (!) 56  Resp: 16  13 16   Temp:  98.3 F (36.8 C)    TempSrc:  Oral    SpO2: 98%  96% 100%  Weight:      Height:        General - Middle aged African American female, no distress HEENT - PERRLA, EOMI, atraumatic head, non tender sinuses. Lung - Clear, no rales, rhonchi, wheezes. Heart - S1, S2 heard, no murmurs, rubs, trace pedal edema. Abdomen - Soft, non tender, bowel sounds good Neuro - Alert, awake and oriented x 3, non focal exam. Skin - Warm and dry.  Data Reviewed:      Latest Ref Rng & Units 11/26/2024    5:25 AM 11/25/2024    4:50 PM 06/06/2015    8:19 AM  CBC  WBC 4.0 - 10.5 K/uL 5.1  4.8  5.6   Hemoglobin 12.0 - 15.0 g/dL 88.4  86.5  87.2   Hematocrit 36.0 - 46.0 % 32.4  38.8  36.7   Platelets 150 - 400 K/uL 178  196  252       Latest Ref Rng & Units 11/27/2024    2:53 AM 11/26/2024  5:25 AM 11/26/2024   12:39 AM  BMP  Glucose 70 - 99 mg/dL 685  746  758   BUN 6 - 20 mg/dL 9  8  10    Creatinine 0.44 - 1.00 mg/dL 9.26  9.29  9.28   Sodium 135 - 145 mmol/L 137  134  133   Potassium 3.5 - 5.1 mmol/L 3.9  3.4  3.1   Chloride 98 - 111 mmol/L 103  100  98   CO2 22 - 32 mmol/L 24  25  24    Calcium 8.9 - 10.3 mg/dL 8.9  9.0  9.1    DG Chest 1 View Result Date: 11/25/2024 EXAM: 1 VIEW(S) XRAY OF THE CHEST 11/25/2024 06:34:00 PM COMPARISON: None available. CLINICAL HISTORY: SOB (shortness of breath) FINDINGS: LUNGS AND PLEURA: No focal pulmonary opacity. No pleural effusion. No pneumothorax. HEART AND MEDIASTINUM: Atherosclerotic calcifications. No acute abnormality of the cardiac and mediastinal silhouettes. BONES AND SOFT TISSUES: Multilevel  degenerative changes of spine. IMPRESSION: 1. No acute cardiopulmonary abnormality. Electronically signed by: Morgane Naveau MD 11/25/2024 06:38 PM EST RP Workstation: [id number]    Family Communication: Discussed with patient, understand and agree. All questions answered.  Disposition: Status is: Observation The patient remains OBS appropriate and will d/c before 2 midnights.  Planned Discharge Destination: Home     Time spent: 43 minutes  Author: Concepcion Riser, MD 11/27/2024 3:35 PM Secure chat 7am to 7pm For on call review [email or web address].    "

## 2024-11-27 NOTE — Plan of Care (Signed)
   Problem: Health Behavior/Discharge Planning: Goal: Ability to manage health-related needs will improve Outcome: Progressing

## 2024-11-28 ENCOUNTER — Other Ambulatory Visit (HOSPITAL_COMMUNITY): Payer: Self-pay

## 2024-11-28 DIAGNOSIS — E1100 Type 2 diabetes mellitus with hyperosmolarity without nonketotic hyperglycemic-hyperosmolar coma (NKHHC): Secondary | ICD-10-CM | POA: Diagnosis not present

## 2024-11-28 DIAGNOSIS — E876 Hypokalemia: Secondary | ICD-10-CM | POA: Insufficient documentation

## 2024-11-28 DIAGNOSIS — R7989 Other specified abnormal findings of blood chemistry: Secondary | ICD-10-CM | POA: Insufficient documentation

## 2024-11-28 DIAGNOSIS — I10 Essential (primary) hypertension: Secondary | ICD-10-CM | POA: Diagnosis not present

## 2024-11-28 DIAGNOSIS — E119 Type 2 diabetes mellitus without complications: Secondary | ICD-10-CM

## 2024-11-28 LAB — GLUCOSE, CAPILLARY
Glucose-Capillary: 273 mg/dL — ABNORMAL HIGH (ref 70–99)
Glucose-Capillary: 305 mg/dL — ABNORMAL HIGH (ref 70–99)

## 2024-11-28 LAB — BASIC METABOLIC PANEL WITH GFR
Anion gap: 8 (ref 5–15)
BUN: 13 mg/dL (ref 6–20)
CO2: 24 mmol/L (ref 22–32)
Calcium: 9.3 mg/dL (ref 8.9–10.3)
Chloride: 100 mmol/L (ref 98–111)
Creatinine, Ser: 0.81 mg/dL (ref 0.44–1.00)
GFR, Estimated: >60 mL/min
Glucose, Bld: 293 mg/dL — ABNORMAL HIGH (ref 70–99)
Potassium: 3.7 mmol/L (ref 3.5–5.1)
Sodium: 132 mmol/L — ABNORMAL LOW (ref 135–145)

## 2024-11-28 MED ORDER — CHLORHEXIDINE GLUCONATE CLOTH 2 % EX PADS: 6.0000 | MEDICATED_PAD | Freq: Every day | CUTANEOUS | Status: DC

## 2024-11-28 MED ORDER — ACCU-CHEK GUIDE W/DEVICE KIT
1.0000 | PACK | 0 refills | Status: AC
  Filled 2024-11-28: qty 1, 30d supply, fill #0

## 2024-11-28 MED ORDER — LANCET DEVICE MISC
1.0000 | 0 refills | Status: AC
  Filled 2024-11-28: qty 1, fill #0

## 2024-11-28 MED ORDER — LANCETS MISC
1.0000 | 0 refills | Status: AC
  Filled 2024-11-28: qty 100, 25d supply, fill #0

## 2024-11-28 MED ORDER — IBUPROFEN 200 MG PO TABS
400.0000 mg | ORAL_TABLET | Freq: Once | ORAL | Status: AC
  Administered 2024-11-28: 400 mg via ORAL
  Filled 2024-11-28: qty 2

## 2024-11-28 MED ORDER — INSULIN GLARGINE 100 UNIT/ML SOLOSTAR PEN
20.0000 [IU] | PEN_INJECTOR | Freq: Every day | SUBCUTANEOUS | 11 refills | Status: AC
  Filled 2024-11-28: qty 15, 75d supply, fill #0

## 2024-11-28 MED ORDER — INSULIN ASPART 100 UNIT/ML FLEXPEN
6.0000 [IU] | PEN_INJECTOR | Freq: Three times a day (TID) | SUBCUTANEOUS | 2 refills | Status: AC
  Filled 2024-11-28: qty 6, 30d supply, fill #0

## 2024-11-28 MED ORDER — BLOOD GLUCOSE TEST VI STRP
1.0000 | ORAL_STRIP | 0 refills | Status: AC
  Filled 2024-11-28: qty 100, 25d supply, fill #0

## 2024-11-28 MED ORDER — PEN NEEDLES 32G X 4 MM MISC
1.0000 | 0 refills | Status: AC
  Filled 2024-11-28 (×2): qty 100, 25d supply, fill #0

## 2024-11-28 MED ORDER — ACETAMINOPHEN 325 MG PO TABS: 650.0000 mg | ORAL_TABLET | Freq: Four times a day (QID) | ORAL | Status: DC | PRN

## 2024-11-28 NOTE — Discharge Summary (Incomplete)
 " Physician Discharge Summary   Patient: Tara Barry MRN: [id number] DOB: Jan 17, 1966  Admit date:     11/25/2024  Discharge date: {dischdate:26783}  Discharge Physician: Concepcion Riser   PCP: Patient, No Pcp Per   Recommendations at discharge:  {Tip this will not be part of the note when signed- Example include specific recommendations for outpatient follow-up, pending tests to follow-up on. (Optional):26781}  PCP follow up in 1 week  Discharge Diagnoses: Principal Problem:   Hyperosmolar hyperglycemic state (HHS) (HCC) Active Problems:   Hypertension   New onset type 2 diabetes mellitus (HCC)   Pseudohyponatremia   Hypokalemia  Resolved Problems:   * No resolved hospital problems. *  Hospital Course: 59 year old female with past medical history of hypertension presented to hospital from primary care office with complaints of hyperglycemia. She has been having polyphagia, polydipsia and polyuria for the last 2 months or so and subsequently started developing blurry vision with leg cramping went to see her PCP, where her blood sugars very high advised to come to ED.    In the ED, labs showed Na 120, sugars 824, cr 1.18, gap 14, co2 21 admitted to TRH service for HHS due to new diabetes diagnosis.  Assessment and Plan: Hyperosmolar hyperglycemic state New onset type 2 diabetes- A1c noted 11.3. Sugars improved now with IV insulin , transitioned to SQ insulin  regimen 11/26/24. Prescribed Lantus  increased to 20 units with 6 units novolog  tid and sliding scale. She got diet, diabetes management and insulin  teaching. CGM education given. Scripts sent to our pharmacy along with diabetes supplies. Advised PCP follow up for further diabetes management Pseudohyponatremia- Due to high blood sugars. Improved.   Hypertension- Resumed home dose norvasc . Continue IV hydralazine  as needed.   Mild AKI- Improved with fluids. Monitor daily renal function.   Hypokalemia-  K improved  with IV kcl. Monitor daily electrolytes and replete accordingly.       {Tip this will not be part of the note when signed Body mass index is 25.42 kg/m. , ,  (Optional):26781}  {(NOTE) Pain control PDMP Statment (Optional):26782} Consultants: none Procedures performed: none  Disposition: Home Diet recommendation:  Discharge Diet Orders (From admission, onward)     Start     Ordered   11/28/24 0000  Diet - low sodium heart healthy        11/28/24 1205   11/28/24 0000  Diet Carb Modified        11/28/24 1205           Cardiac and Carb modified diet DISCHARGE MEDICATION: Allergies as of 11/28/2024       Reactions   Porcine (pork) Protein-containing Drug Products Other (See Comments)   Does not eat pork, non religious   Tylenol  [acetaminophen ] Nausea And Vomiting        Medication List     STOP taking these medications    ibuprofen  600 MG tablet Commonly known as: ADVIL    meloxicam  15 MG tablet Commonly known as: Mobic        TAKE these medications    Accu-Chek Guide Test test strip Generic drug: glucose blood Use up to four times daily as directed.   Accu-Chek Guide w/Device Kit Use up to four times daily as directed.   Accu-Chek Softclix Lancets lancets Use up to four times daily as directed.   amLODipine  5 MG tablet Commonly known as: NORVASC  Take 5 mg by mouth daily.   Embecta Pen Needle Nano 2 Gen 32G X 4 MM Misc Generic  drug: Insulin  Pen Needle Use four times daily as directed.   fluticasone 50 MCG/ACT nasal spray Commonly known as: FLONASE Place 1 spray into both nostrils daily as needed.   Lancet Device Misc 1 each by Does not apply route as directed. Dispense based on patient and insurance preference. Use up to four times daily as directed. (FOR ICD-10 E10.9, E11.9).   Lantus  SoloStar 100 UNIT/ML Solostar Pen Generic drug: insulin  glargine Inject 20 Units into the skin daily.   NovoLOG  FlexPen 100 UNIT/ML FlexPen Generic drug:  insulin  aspart Inject 6 Units into the skin 3 (three) times daily with meals. Only take if eating a meal AND Blood Glucose (BG) is 80 or higher.        Discharge Exam: Filed Weights   11/26/24 0500 11/27/24 0500 11/28/24 0500  Weight: 69.2 kg 69.2 kg 69.3 kg      11/28/2024    8:00 AM 11/28/2024    6:28 AM 11/28/2024    6:00 AM  Vitals with BMI  Systolic 161 140   Diastolic 68 89   Pulse 58 57 58   General - Middle aged African American female, no distress HEENT - PERRLA, EOMI, atraumatic head, non tender sinuses. Lung - Clear, no rales, rhonchi, wheezes. Heart - S1, S2 heard, no murmurs, rubs, trace pedal edema. Abdomen - Soft, non tender, bowel sounds good Neuro - Alert, awake and oriented x 3, non focal exam. Skin - Warm and dry.  Condition at discharge: stable  The results of significant diagnostics from this hospitalization (including imaging, microbiology, ancillary and laboratory) are listed below for reference.   Imaging Studies: DG Chest 1 View Result Date: 11/25/2024 EXAM: 1 VIEW(S) XRAY OF THE CHEST 11/25/2024 06:34:00 PM COMPARISON: None available. CLINICAL HISTORY: SOB (shortness of breath) FINDINGS: LUNGS AND PLEURA: No focal pulmonary opacity. No pleural effusion. No pneumothorax. HEART AND MEDIASTINUM: Atherosclerotic calcifications. No acute abnormality of the cardiac and mediastinal silhouettes. BONES AND SOFT TISSUES: Multilevel degenerative changes of spine. IMPRESSION: 1. No acute cardiopulmonary abnormality. Electronically signed by: Morgane Naveau MD 11/25/2024 06:38 PM EST RP Workstation: [id number]    Microbiology: Results for orders placed or performed during the hospital encounter of 11/25/24  MRSA Next Gen by PCR, Nasal     Status: None   Collection Time: 11/25/24 12:08 AM   Specimen: Nasal Mucosa; Nasal Swab  Result Value Ref Range Status   MRSA by PCR Next Gen NOT DETECTED NOT DETECTED Final    Comment: (NOTE) The GeneXpert MRSA Assay (FDA  approved for NASAL specimens only), is one component of a comprehensive MRSA colonization surveillance program. It is not intended to diagnose MRSA infection nor to guide or monitor treatment for MRSA infections. Test performance is not FDA approved in patients less than 25 years old. Performed at [facility], [address], [place], [place] [address]     Labs: CBC: Recent Labs  Lab 11/25/24 1650 11/26/24 0525  WBC 4.8 5.1  NEUTROABS 2.2  --   HGB 13.4 11.5*  HCT 38.8 32.4*  MCV 88.0 85.5  PLT 196 178   Basic Metabolic Panel: Recent Labs  Lab 11/25/24 2016 11/26/24 0039 11/26/24 0525 11/27/24 0253 11/28/24 0311  NA 126* 133* 134* 137 132*  K 3.8 3.1* 3.4* 3.9 3.7  CL 92* 98 100 103 100  CO2 21* 24 25 24 24   GLUCOSE 536* 241* 253* 314* 293*  BUN 12 10 8 9 13   CREATININE 0.97 0.71 0.70 0.73 0.81  CALCIUM 9.4 9.1 9.0 8.9 9.3  MG  --   --  1.9  --   --   PHOS 2.4*  --   --   --   --    Liver Function Tests: Recent Labs  Lab 11/25/24 1650  AST 36  ALT 53*  ALKPHOS 257*  BILITOT 0.5  PROT 8.2*  ALBUMIN 3.8   CBG: Recent Labs  Lab 11/27/24 1205 11/27/24 1601 11/27/24 2158 11/28/24 0748 11/28/24 1143  GLUCAP 345* 235* 288* 273* 305*    Discharge time spent: 35 minutes.  Signed: Concepcion Riser, MD [facility] 11/28/2024 "

## 2024-11-28 NOTE — Plan of Care (Signed)
  Problem: Clinical Measurements: Goal: Ability to maintain clinical measurements within normal limits will improve Outcome: Completed/Met Goal: Will remain free from infection Outcome: Completed/Met Goal: Diagnostic test results will improve Outcome: Completed/Met Goal: Respiratory complications will improve Outcome: Completed/Met Goal: Cardiovascular complication will be avoided Outcome: Completed/Met   Problem: Activity: Goal: Risk for activity intolerance will decrease Outcome: Completed/Met

## 2024-11-29 ENCOUNTER — Other Ambulatory Visit (HOSPITAL_COMMUNITY): Payer: Self-pay

## 2024-11-29 NOTE — Discharge Summary (Signed)
 " Physician Discharge Summary   Patient: Tara Barry MRN: [id number] DOB: [birth year]  Admit date:     11/25/2024  Discharge date: 11/28/2024  Discharge Physician: Concepcion Riser   PCP: Patient, No Pcp Per   Recommendations at discharge:   PCP follow-up in 1 week.  Discharge Diagnoses: Principal Problem:   Hyperosmolar hyperglycemic state (HHS) (HCC) Active Problems:   Hypertension   New onset type 2 diabetes mellitus (HCC)   Pseudohyponatremia   Hypokalemia  Resolved Problems:   * No resolved hospital problems. *  Hospital Course: 59 year old female with past medical history of hypertension presented to hospital from primary care office with complaints of hyperglycemia. She has been having polyphagia, polydipsia and polyuria for the last 2 months or so and subsequently started developing blurry vision with leg cramping went to see her PCP, where her blood sugars very high advised to come to ED.    In the ED, labs showed Na 120, sugars 824, cr 1.18, gap 14, co2 21 admitted to [facility] service for HHS due to new diabetes diagnosis.   Assessment and Plan: Hyperosmolar hyperglycemic state New onset type 2 diabetes- A1c noted 11.3. Sugars improved now with IV insulin , transitioned to SQ insulin  regimen 11/26/24. Lantus  increased to 20 unit with 6 units novolog  tid and sliding scale. She got diet, diabetes management and insulin  teaching.  Diabetes nurse evaluated her.  CGM education provided. Patient is advised to be compliant with insulin  regimen.  She is hemodynamically stable to be discharged home.  Prescription for insulin , diabetes supplies sent to pharmacy. Advised PCP follow-up upon discharge as instructed.  Patient and husband understand agree with the discharge plan.  Pseudohyponatremia- Due to high blood sugars. Improved.   Hypertension- Resumed home dose norvasc .   Mild AKI- Improved with fluids. Monitor daily renal function.   Hypokalemia-  K improved with IV  kcl. Monitor electrolytes and replete as outpatient.       Consultants: None Procedures performed: None Disposition: Home Diet recommendation:  Discharge Diet Orders (From admission, onward)     Start     Ordered   11/28/24 0000  Diet - low sodium heart healthy        11/28/24 1205   11/28/24 0000  Diet Carb Modified        11/28/24 1205           Cardiac and Carb modified diet DISCHARGE MEDICATION: Allergies as of 11/28/2024       Reactions   Porcine (pork) Protein-containing Drug Products Other (See Comments)   Does not eat pork, non religious   Tylenol  [acetaminophen ] Nausea And Vomiting        Medication List     STOP taking these medications    ibuprofen  600 MG tablet Commonly known as: ADVIL    meloxicam  15 MG tablet Commonly known as: Mobic        TAKE these medications    Accu-Chek Guide Test test strip Generic drug: glucose blood Use up to four times daily as directed.   Accu-Chek Guide w/Device Kit Use up to four times daily as directed.   Accu-Chek Softclix Lancets lancets Use up to four times daily as directed.   amLODipine  5 MG tablet Commonly known as: NORVASC  Take 5 mg by mouth daily.   Embecta Pen Needle Nano 2 Gen 32G X 4 MM Misc Generic drug: Insulin  Pen Needle Use four times daily as directed.   fluticasone 50 MCG/ACT nasal spray Commonly known as: FLONASE Place 1 spray into  both nostrils daily as needed.   Lancet Device Misc 1 each by Does not apply route as directed. Dispense based on patient and insurance preference. Use up to four times daily as directed. (FOR ICD-10 E10.9, E11.9).   Lantus  SoloStar 100 UNIT/ML Solostar Pen Generic drug: insulin  glargine Inject 20 Units into the skin daily.   NovoLOG  FlexPen 100 UNIT/ML FlexPen Generic drug: insulin  aspart Inject 6 Units into the skin 3 (three) times daily with meals. Only take if eating a meal AND Blood Glucose (BG) is 80 or higher.        Discharge  Exam: Filed Weights   11/26/24 0500 11/27/24 0500 11/28/24 0500  Weight: 69.2 kg 69.2 kg 69.3 kg      11/28/2024    8:00 AM 11/28/2024    6:28 AM 11/28/2024    6:00 AM  Vitals with BMI  Systolic 161 140   Diastolic 68 89   Pulse 58 57 58    General - Middle aged African American female, no distress HEENT - PERRLA, EOMI, atraumatic head, non tender sinuses. Lung - Clear, no rales, rhonchi, wheezes. Heart - S1, S2 heard, no murmurs, rubs, trace pedal edema. Abdomen - Soft, non tender, bowel sounds good Neuro - Alert, awake and oriented x 3, non focal exam. Skin - Warm and dry.  Condition at discharge: stable  The results of significant diagnostics from this hospitalization (including imaging, microbiology, ancillary and laboratory) are listed below for reference.   Imaging Studies: DG Chest 1 View Result Date: 11/25/2024 EXAM: 1 VIEW(S) XRAY OF THE CHEST 11/25/2024 06:34:00 PM COMPARISON: None available. CLINICAL HISTORY: SOB (shortness of breath) FINDINGS: LUNGS AND PLEURA: No focal pulmonary opacity. No pleural effusion. No pneumothorax. HEART AND MEDIASTINUM: Atherosclerotic calcifications. No acute abnormality of the cardiac and mediastinal silhouettes. BONES AND SOFT TISSUES: Multilevel degenerative changes of spine. IMPRESSION: 1. No acute cardiopulmonary abnormality. Electronically signed by: Morgane Naveau MD 11/25/2024 06:38 PM EST RP Workstation: [id number]    Microbiology: Results for orders placed or performed during the hospital encounter of 11/25/24  MRSA Next Gen by PCR, Nasal     Status: None   Collection Time: 11/25/24 12:08 AM   Specimen: Nasal Mucosa; Nasal Swab  Result Value Ref Range Status   MRSA by PCR Next Gen NOT DETECTED NOT DETECTED Final    Comment: (NOTE) The GeneXpert MRSA Assay (FDA approved for NASAL specimens only), is one component of a comprehensive MRSA colonization surveillance program. It is not intended to diagnose MRSA infection nor to  guide or monitor treatment for MRSA infections. Test performance is not FDA approved in patients less than 45 years old. Performed at [facility], [address] [place], [place] [address]     Labs: CBC: Recent Labs  Lab 11/25/24 1650 11/26/24 0525  WBC 4.8 5.1  NEUTROABS 2.2  --   HGB 13.4 11.5*  HCT 38.8 32.4*  MCV 88.0 85.5  PLT 196 178   Basic Metabolic Panel: Recent Labs  Lab 11/25/24 2016 11/26/24 0039 11/26/24 0525 11/27/24 0253 11/28/24 0311  NA 126* 133* 134* 137 132*  K 3.8 3.1* 3.4* 3.9 3.7  CL 92* 98 100 103 100  CO2 21* 24 25 24 24   GLUCOSE 536* 241* 253* 314* 293*  BUN 12 10 8 9 13   CREATININE 0.97 0.71 0.70 0.73 0.81  CALCIUM 9.4 9.1 9.0 8.9 9.3  MG  --   --  1.9  --   --   PHOS 2.4*  --   --   --   --  Liver Function Tests: Recent Labs  Lab 11/25/24 1650  AST 36  ALT 53*  ALKPHOS 257*  BILITOT 0.5  PROT 8.2*  ALBUMIN 3.8   CBG: Recent Labs  Lab 11/27/24 1205 11/27/24 1601 11/27/24 2158 11/28/24 0748 11/28/24 1143  GLUCAP 345* 235* 288* 273* 305*    Discharge time spent: 35 minutes.  Signed: Concepcion Riser, MD [facility] 11/29/2024 "

## 2024-12-06 ENCOUNTER — Telehealth: Payer: Self-pay | Admitting: Vascular Surgery

## 2024-12-07 ENCOUNTER — Ambulatory Visit (HOSPITAL_COMMUNITY): Admitting: Vascular Surgery

## 2024-12-07 ENCOUNTER — Ambulatory Visit (HOSPITAL_COMMUNITY)

## 2024-12-14 ENCOUNTER — Ambulatory Visit (HOSPITAL_COMMUNITY)
Admission: RE | Admit: 2024-12-14 | Discharge: 2024-12-14 | Disposition: A | Source: Ambulatory Visit | Attending: Vascular Surgery

## 2024-12-14 DIAGNOSIS — I739 Peripheral vascular disease, unspecified: Secondary | ICD-10-CM | POA: Diagnosis not present

## 2024-12-14 LAB — VAS US ABI WITH/WO TBI
Left ABI: 0.71
Right ABI: 0.92

## 2024-12-15 ENCOUNTER — Encounter: Payer: Self-pay | Admitting: Skilled Nursing Facility1

## 2024-12-15 ENCOUNTER — Encounter: Admitting: Skilled Nursing Facility1

## 2024-12-15 VITALS — Wt 155.6 lb

## 2024-12-15 DIAGNOSIS — R739 Hyperglycemia, unspecified: Secondary | ICD-10-CM

## 2024-12-15 NOTE — Progress Notes (Signed)
 Medical Nutrition Therapy  Appointment Start time:  10:00  Appointment End time:  11:00  Primary concerns today: DM management   Referral diagnosis: Hyperglycemia   NUTRITION ASSESSMENT   Clinical Medical Hx: DM Medications: Lantus  20 units (3pm), Novolog  at 9am, 3pm, and 10pm 6 units each time taken right after eating  Labs: A1C 13.3 Notable Signs/Symptoms: none reported   Lifestyle & Dietary Hx  Pt arrives with her supportive husband.  Pt states she tests her blood sugars 3 times a day: fasting: 122, post prandial 150-160.   Estimated daily fluid intake:  oz Supplements:  Sleep:  Stress / self-care:  Current average weekly physical activity: ADL's  24-Hr Dietary Recall: no longer skip meals  First Meal: egg turkey sausage and toast applesauce  Snack:  Second Meal: turkey sandwich or salad Snack:  Third Meal: salad + chicken Snack:  Beverages: water, sugar free juice   NUTRITION INTERVENTION  Nutrition education (E-1) on the following topics:  DM Pathophysiology Insulin  injection rotation Insulin  management education Daily foot exams with DM Controlled blood sugars via balanced diet and insulin  management   Handouts Provided Include  DM book Detailed MyPlate  Learning Style & Readiness for Change Teaching method utilized: Visual & Auditory  Demonstrated degree of understanding via: Teach Back  Barriers to learning/adherence to lifestyle change: unidentified   Goals Established by Pt I will take my Novolog  (short acting insulin ) 10 minutes BEFORE eating Novolog  covers your carb Lantus  works for you all day in the background so this one would not need to change Change up where you inject your insulin  as long as it is fatty tissue  Fasting (after you have not eaten anything for 8+ hours) under 130 2 hours after eating under 180 Ask your doctor about the CGM Brush your teeth 2 times daily and floss daily Be sure to go to the eye doctor yearly Check your  feet daily 4 ounces of 100% fruit juice is okay to drink   MONITORING & EVALUATION Dietary intake, weekly physical activity  Next Steps  Patient is to return in 2 months.

## 2025-01-04 ENCOUNTER — Encounter: Admitting: Vascular Surgery

## 2025-01-12 ENCOUNTER — Encounter: Admitting: Skilled Nursing Facility1
